# Patient Record
Sex: Female | Born: 1975 | Race: White | Hispanic: No | Smoking: Current every day smoker
Health system: Southern US, Community
[De-identification: ages and names within clinical notes are randomized; demographics above are authoritative.]

## PROBLEM LIST (undated history)

## (undated) DIAGNOSIS — K581 Irritable bowel syndrome with constipation: Secondary | ICD-10-CM

## (undated) DIAGNOSIS — I2699 Other pulmonary embolism without acute cor pulmonale: Secondary | ICD-10-CM

## (undated) DIAGNOSIS — N920 Excessive and frequent menstruation with regular cycle: Secondary | ICD-10-CM

## (undated) DIAGNOSIS — M545 Low back pain, unspecified: Secondary | ICD-10-CM

## (undated) DIAGNOSIS — G8929 Other chronic pain: Secondary | ICD-10-CM

## (undated) DIAGNOSIS — D242 Benign neoplasm of left breast: Secondary | ICD-10-CM

## (undated) DIAGNOSIS — N943 Premenstrual tension syndrome: Secondary | ICD-10-CM

## (undated) HISTORY — DX: Benign neoplasm of left breast: D24.2

## (undated) HISTORY — DX: Premenstrual tension syndrome: N94.3

## (undated) HISTORY — DX: Excessive and frequent menstruation with regular cycle: N92.0

## (undated) HISTORY — DX: Irritable bowel syndrome with constipation: K58.1

## (undated) HISTORY — DX: Other pulmonary embolism without acute cor pulmonale: I26.99

## (undated) HISTORY — PX: APPENDECTOMY: SHX54

---

## 2001-05-18 ENCOUNTER — Other Ambulatory Visit: Admission: RE | Admit: 2001-05-18 | Discharge: 2001-05-18 | Payer: Self-pay | Admitting: *Deleted

## 2003-06-07 ENCOUNTER — Other Ambulatory Visit: Admission: RE | Admit: 2003-06-07 | Discharge: 2003-06-07 | Payer: Self-pay | Admitting: Obstetrics and Gynecology

## 2004-03-12 ENCOUNTER — Ambulatory Visit (HOSPITAL_COMMUNITY): Admission: RE | Admit: 2004-03-12 | Discharge: 2004-03-12 | Payer: Self-pay | Admitting: Obstetrics and Gynecology

## 2004-05-10 ENCOUNTER — Ambulatory Visit (HOSPITAL_COMMUNITY): Admission: RE | Admit: 2004-05-10 | Discharge: 2004-05-10 | Payer: Self-pay | Admitting: Obstetrics and Gynecology

## 2004-08-21 ENCOUNTER — Other Ambulatory Visit: Admission: RE | Admit: 2004-08-21 | Discharge: 2004-08-21 | Payer: Self-pay | Admitting: Obstetrics and Gynecology

## 2007-09-18 ENCOUNTER — Ambulatory Visit (HOSPITAL_COMMUNITY): Admission: RE | Admit: 2007-09-18 | Discharge: 2007-09-18 | Payer: Self-pay | Admitting: Obstetrics & Gynecology

## 2008-02-05 ENCOUNTER — Ambulatory Visit (HOSPITAL_COMMUNITY): Admission: RE | Admit: 2008-02-05 | Discharge: 2008-02-05 | Payer: Self-pay | Admitting: Obstetrics & Gynecology

## 2008-02-11 ENCOUNTER — Ambulatory Visit (HOSPITAL_COMMUNITY): Admission: RE | Admit: 2008-02-11 | Discharge: 2008-02-11 | Payer: Self-pay | Admitting: Family Medicine

## 2008-02-16 ENCOUNTER — Observation Stay (HOSPITAL_COMMUNITY): Admission: EM | Admit: 2008-02-16 | Discharge: 2008-02-17 | Payer: Self-pay | Admitting: Emergency Medicine

## 2008-02-17 ENCOUNTER — Encounter: Payer: Self-pay | Admitting: Obstetrics and Gynecology

## 2008-03-11 ENCOUNTER — Ambulatory Visit: Payer: Self-pay | Admitting: Internal Medicine

## 2008-03-28 ENCOUNTER — Ambulatory Visit: Payer: Self-pay | Admitting: Internal Medicine

## 2008-03-28 ENCOUNTER — Ambulatory Visit (HOSPITAL_COMMUNITY): Admission: RE | Admit: 2008-03-28 | Discharge: 2008-03-28 | Payer: Self-pay | Admitting: Internal Medicine

## 2008-03-28 ENCOUNTER — Encounter: Payer: Self-pay | Admitting: Internal Medicine

## 2008-04-11 ENCOUNTER — Ambulatory Visit: Payer: Self-pay | Admitting: Gastroenterology

## 2008-04-14 ENCOUNTER — Ambulatory Visit (HOSPITAL_COMMUNITY): Admission: RE | Admit: 2008-04-14 | Discharge: 2008-04-14 | Payer: Self-pay | Admitting: Internal Medicine

## 2008-04-29 ENCOUNTER — Encounter (INDEPENDENT_AMBULATORY_CARE_PROVIDER_SITE_OTHER): Payer: Self-pay | Admitting: General Surgery

## 2008-04-29 ENCOUNTER — Ambulatory Visit (HOSPITAL_COMMUNITY): Admission: RE | Admit: 2008-04-29 | Discharge: 2008-04-29 | Payer: Self-pay | Admitting: General Surgery

## 2008-09-19 ENCOUNTER — Other Ambulatory Visit: Admission: RE | Admit: 2008-09-19 | Discharge: 2008-09-19 | Payer: Self-pay | Admitting: Obstetrics and Gynecology

## 2008-11-19 ENCOUNTER — Emergency Department (HOSPITAL_COMMUNITY): Admission: EM | Admit: 2008-11-19 | Discharge: 2008-11-19 | Payer: Self-pay | Admitting: Emergency Medicine

## 2009-02-17 IMAGING — US US PELVIS COMPLETE MODIFY
1 series · 14 of 25 positions shown · non-contrast
Comparison: none

HISTORY: Right lower quadrant pain, history of ovarian cyst

[Series 1: us pelvis complete modify · 0.30mm/px · 14 of 69 slices shown]
[im 1/69]
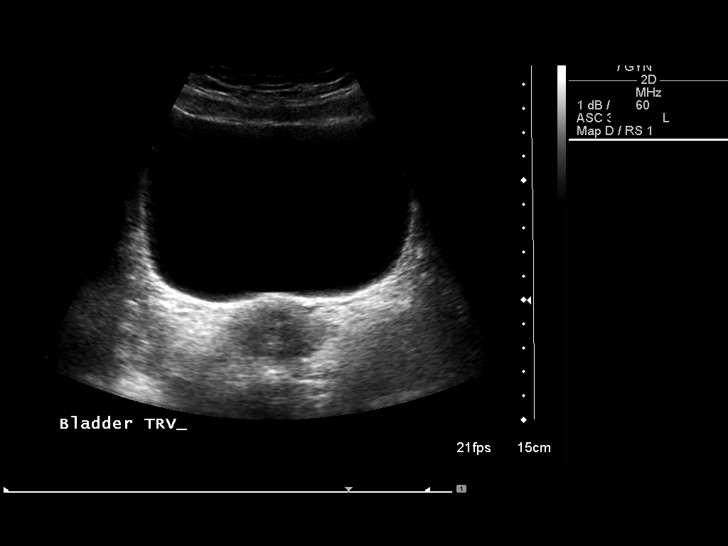
[im 6/69]
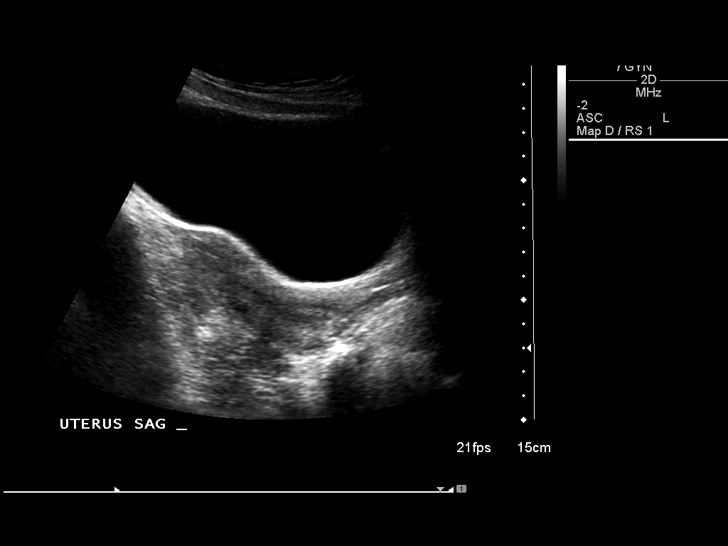
[im 12/69]
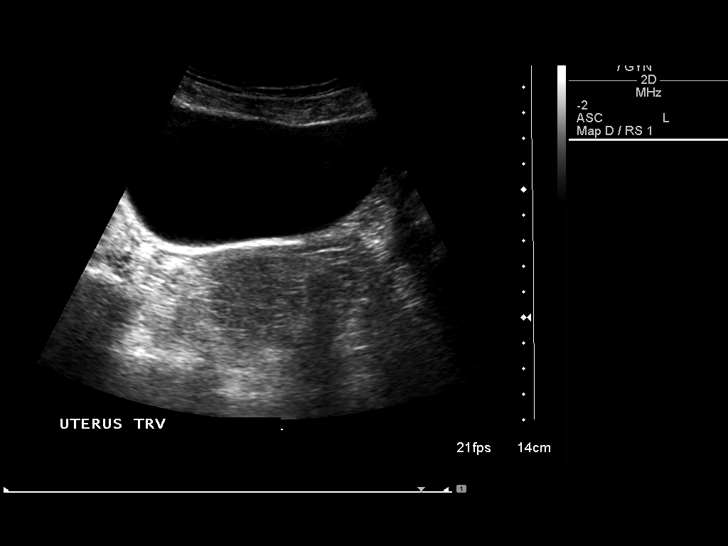
[im 18/69]
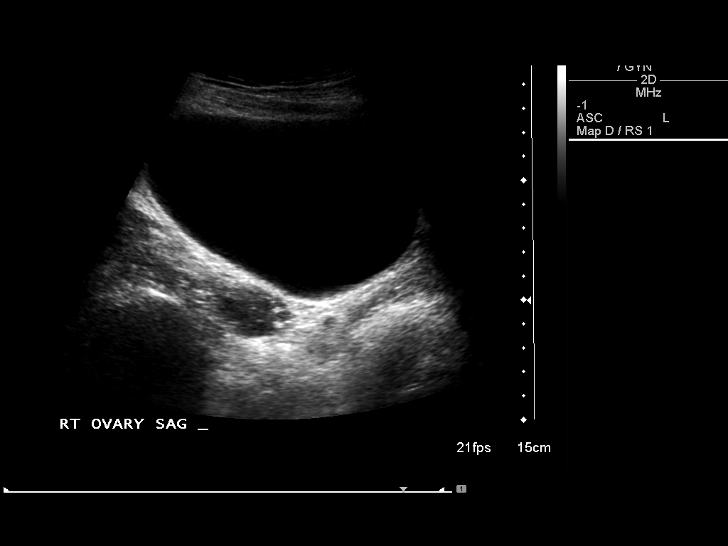
[im 23/69]
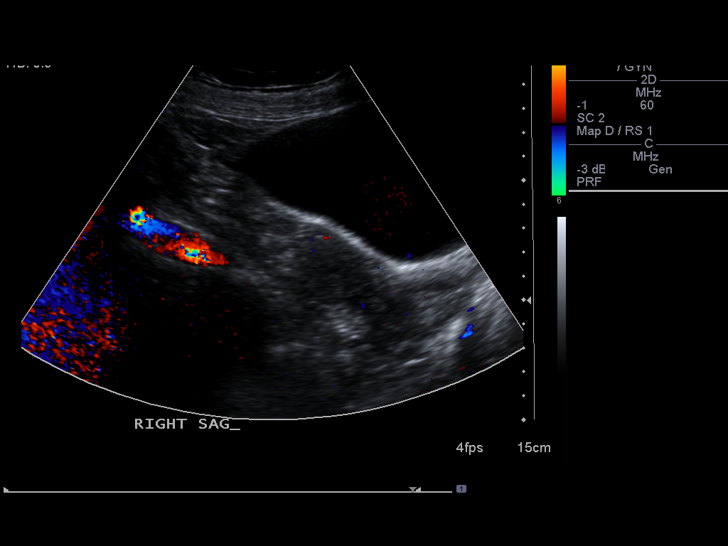
[im 26/69]
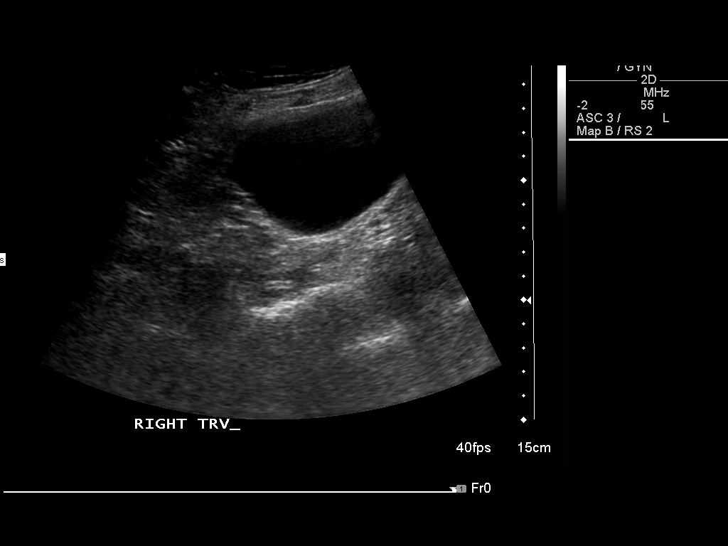
[im 32/69]
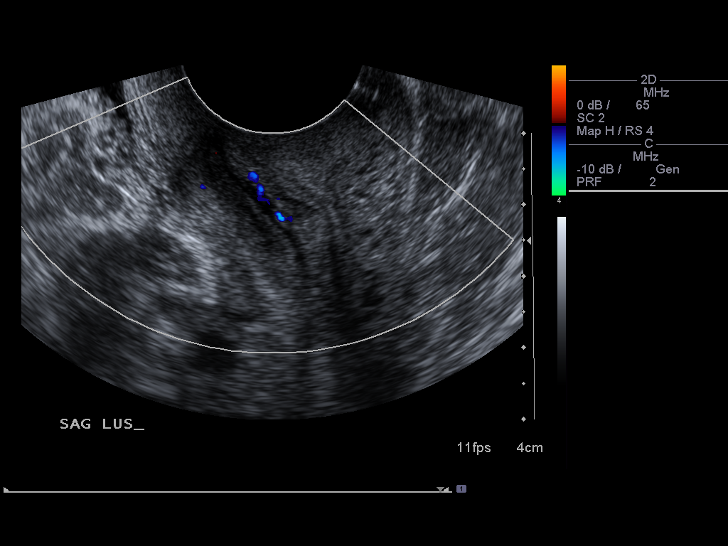
[im 37/69]
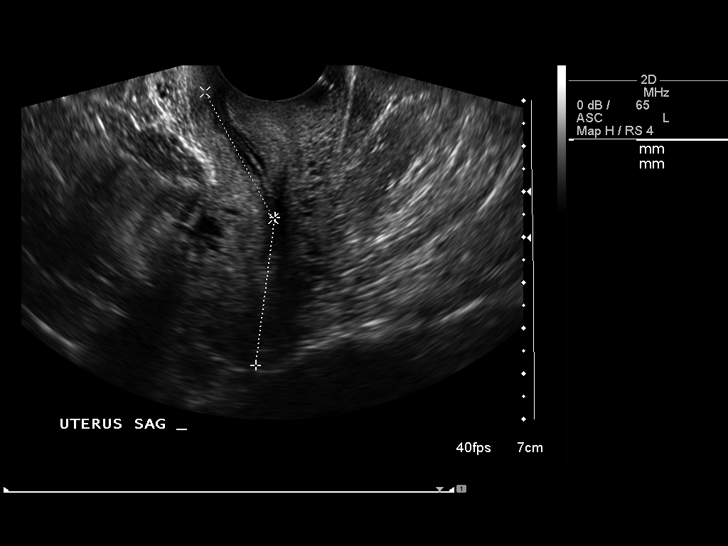
[im 43/69]
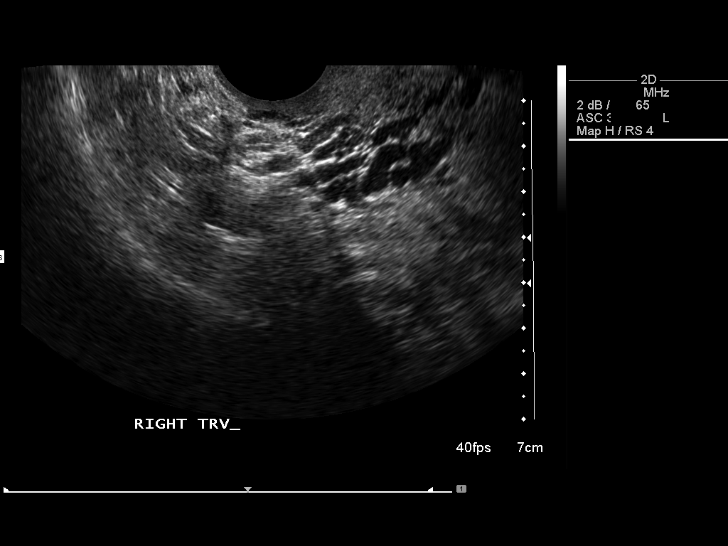
[im 46/69]
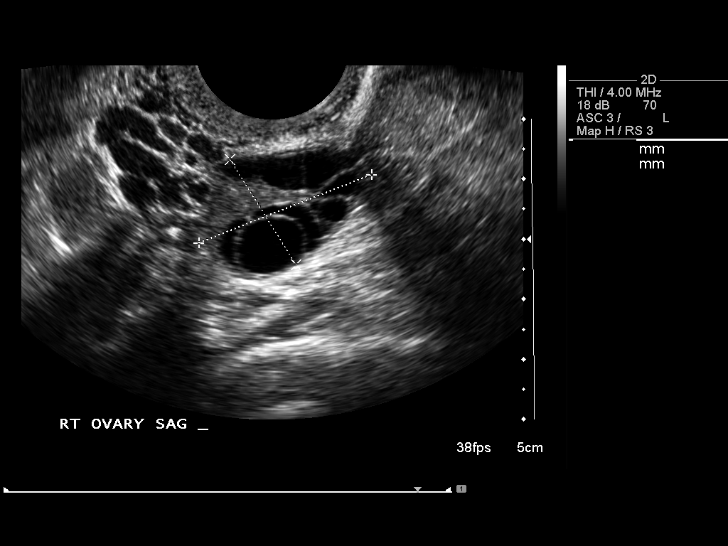
[im 52/69]
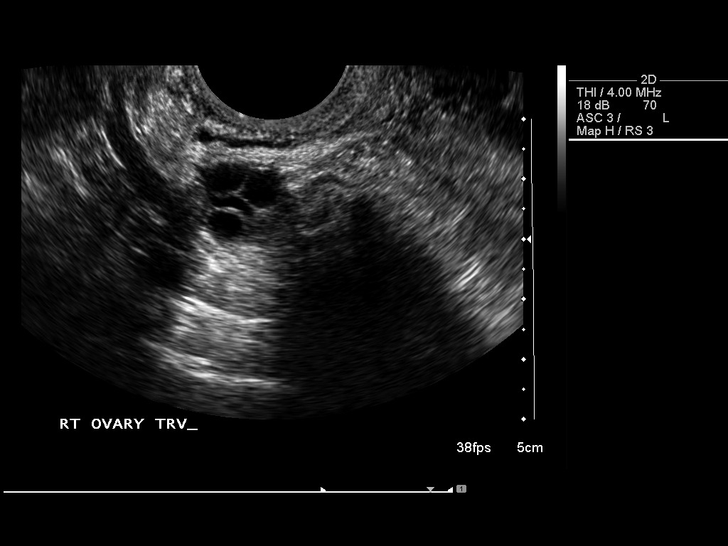
[im 57/69]
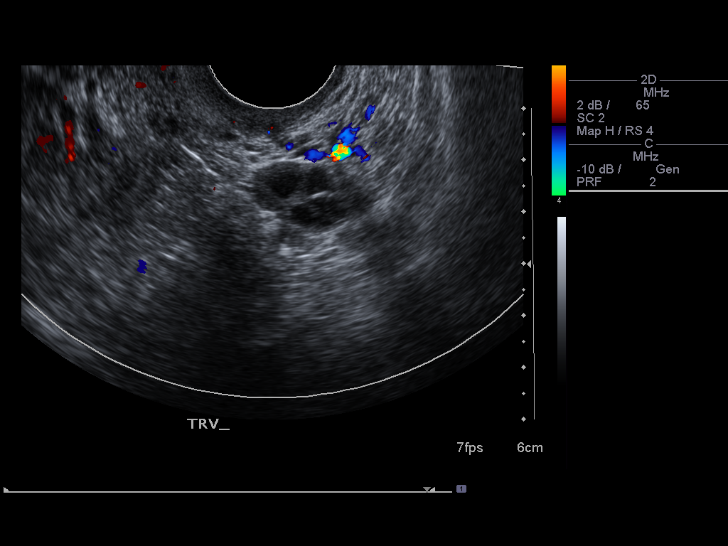
[im 63/69]
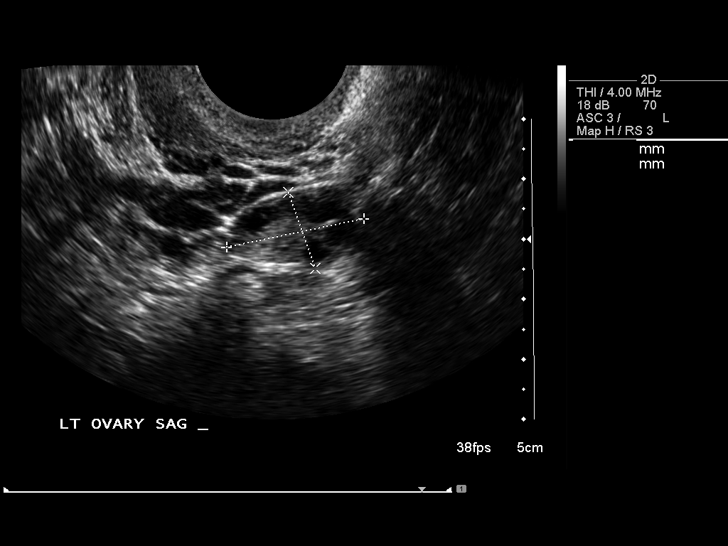
[im 69/69]
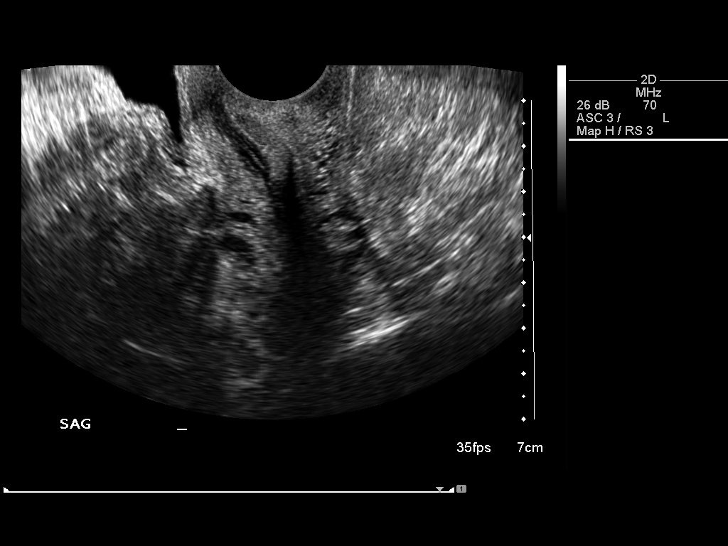

[14 of 25 positions shown; findings below may reference images not displayed]

ULTRASOUND PELVIS TRANSABDOMINAL COMPLETE MODIFIED:
ULTRASOUND PELVIS TRANSVAGINAL NON-OB:

Transabdominal and endovaginal sonography of pelvis performed.
Uterus, ovaries, adnexal regions, and pelvic cul-de-sac assessed.

Uterus anteverted, 7.5 cm length x 2.3 cm AP x 4.3 cm transverse.
Uterus poorly visualized on transvaginal imaging due to anteversion.
Endometrial stripe approximately 7 mm thick on transabdominal images, though
suboptimally visualized.
At the lower uterine segment, irregular hypoechoic focus is identified at
endometrial complex, which could represent polyp, blood, or mass.
No free pelvic fluid.
Left ovary normal size and morphology, 2.3 x 1.3 x 2.1 cm.
Right ovary normal size and morphology, tube 0.1 x 2.3 x 3.1 cm.
No adnexal masses.
IMPRESSION: Unremarkable appearance of ovaries.
Anteverted uterus, not well imaged, with questionable area of hypoechogenicity
at lower uterine segment which could represent blood, polyp, or tumor.
Consider MR followup to characterize or followup ultrasound in 2 to 3 months to
establish stability.

## 2009-08-27 ENCOUNTER — Inpatient Hospital Stay (HOSPITAL_COMMUNITY): Admission: AD | Admit: 2009-08-27 | Discharge: 2009-08-27 | Payer: Self-pay | Admitting: Obstetrics & Gynecology

## 2009-09-11 ENCOUNTER — Ambulatory Visit: Payer: Self-pay | Admitting: Advanced Practice Midwife

## 2009-09-11 ENCOUNTER — Inpatient Hospital Stay (HOSPITAL_COMMUNITY): Admission: AD | Admit: 2009-09-11 | Discharge: 2009-09-13 | Payer: Self-pay | Admitting: Obstetrics and Gynecology

## 2010-05-02 ENCOUNTER — Other Ambulatory Visit: Admission: RE | Admit: 2010-05-02 | Discharge: 2010-05-02 | Payer: Self-pay | Admitting: Obstetrics and Gynecology

## 2011-04-05 LAB — CBC
Hemoglobin: 12 g/dL (ref 12.0–15.0)
MCHC: 34 g/dL (ref 30.0–36.0)
RDW: 13 % (ref 11.5–15.5)

## 2011-04-05 LAB — RPR: RPR Ser Ql: NONREACTIVE

## 2011-04-06 LAB — URINALYSIS, ROUTINE W REFLEX MICROSCOPIC
Glucose, UA: NEGATIVE mg/dL
Ketones, ur: 15 mg/dL — AB
Protein, ur: NEGATIVE mg/dL

## 2011-05-14 NOTE — Consult Note (Signed)
NAMEADILEN, PAVELKO                ACCOUNT NO.:  [id number]   MEDICAL RECORD NO.:  [id number]          PATIENT TYPE:  AMB   LOCATION:  DAY                           FACILITY:  [facility]   PHYSICIAN:  R. Roetta Sessions, M.D. DATE OF BIRTH:  [birth year]   DATE OF CONSULTATION:  DATE OF DISCHARGE:                                 CONSULTATION   REFERRING PHYSICIAN:  Patrica Duel, M.D.   GYNECOLOGIST:  Tilda Burrow, M.D.   REASON FOR CONSULTATION:  Chronic abdominal pain.   HISTORY OF PRESENT ILLNESS:  [name] is a 35 year old female who has  had a 40-month history of constant right lower quadrant abdominal pain  which does seem to radiate across the pelvis to the left as well as into  the low back.  She has had an ultrasound which showed an endometrial  polyp followed by a hysteroscopy with polyp resection and a diagnostic  laparoscopy which was benign.  She has also had a CT scan of the abdomen  and pelvis with contrast, which was negative.  She tells me the pain  feels like a knot.  It is worse when she eats too much.  It generally  is worse later in a day.  The pain is a radiating-type pain.  It is 8/10  on pain scale at the worst.  She does give history of alternating  diarrhea where she can have three loose, urgent stools per day or  constipation where she can go 3-4 days without a bowel movement.  She  denies any rectal bleeding or melena.  She denies any mucus in her  stools.  She rarely has heartburn or indigestion.  She denies any  dysphagia or odynophagia.  Denies any anorexia.  Her weight has steadily  increased.  She has been taking Vicodin intermittently for pain, which  does seem to help, but notes not taking it too frequently as she has a 66-  year-old son.   PAST MEDICAL AND SURGICAL HISTORY:  1. Migraine headaches.  2. Endometrial polypectomy, hysteroscopy and negative diagnostic last      laparoscopy by Dr. Emelda Fear February 16, 2008.   CURRENT MEDICATIONS:  1.  Vicodin 5/500 mg p.r.n.  2. Low-Ogestrel 28 once daily.   ALLERGIES:  No known drug allergies.   FAMILY HISTORY:  There is no known family history of colorectal  carcinoma or liver or chronic GI problems.  Mother, age 48, has history  of breast cancer.  Father, age 22, is healthy.  Two healthy brothers.   SOCIAL HISTORY:  Ms. Moffa is married.  She has a 64-year-old healthy  son.  She is employed as a Therapist, sports.  She has a  10 pack-year history of tobacco use.  Denies any drug use.  Rarely  consumes alcohol.   REVIEW OF SYSTEMS:  See HPI.  GU:  She has noted some frequency but  recent UA was negative.  GYN:  See HPI.  Is on birth control pills.   PHYSICAL EXAM:  VITAL SIGNS:  Weight 211.5 pounds, height 70 inches,  temperature  98.5, blood 102/62, pulse 88.  GENERAL:  She is a well-developed, well-nourished, overweight has female  in no acute distress.  HEENT:  Sclerae are clear, nonicteric.  Conjunctivae are pink.  Pharynx  pink and moist without any lesions.  NECK:  Supple without mass or thyromegaly.  CHEST:  Heart regular rate and rhythm, regular normal S1 and S2, without  murmurs, thrills, rubs or gallops.  Lungs clear to auscultation  bilaterally.  ABDOMEN:  Positive bowel sounds x4.  No bruits auscultated.  Soft,  nontender, nondistended, without palpable mass or hepatosplenomegaly.  No rebound tenderness or guarding.  RECTAL:  No external lesions visualized.  Good sphincter tone.  No  internal masses palpated.  Small amount of light brown stool was  obtained from the vault, which is Hemoccult-positive.   Laboratory studies from February 16, 2008:  Hemoglobin 12.6. hematocrit  37, WBC 10.1, platelets 221.  Sodium 140, potassium 3.9, chloride 107,  CO2 24, glucose 88, BUN 3, creatinine 0.83 and calcium 9.3.  total  bilirubin 0.5, alkaline phosphatase 60, AST 20, ALT 13, total protein  6.5 and albumin 3.5.  Urine HCG was negative.  Urinalysis  negative.   IMPRESSION:  [name] is a 35 year old female with a 19-month history of  chronic abdominal pain that mostly originates in the right lower  quadrant and radiates across her lower pelvis as well as to her low  back.  She does note an alternating diarrhea and constipation pattern,  which is consistent with irritable bowel syndrome.  She recently had a  diagnostic laparoscopy by Dr. Emelda Fear, which was benign.  She is found  to be Hemoccult-positive on exam and recent CT scan of the abdomen and  pelvis was reassuring as well.  I do feel that the Hemoccult-positive  stool along with her chronic abdominal pain and change in bowel habits  warrants further evaluation to rule out inflammatory bowel disease;  however we may be dealing with irritable bowel syndrome and benign  anorectal source of Hemoccult-positive stool.   PLAN:  1. Begin Benefiber or Fiber Choice daily.  2. Levsin 0.125 mg a.c. and bedtime p.r.n. diarrhea, #60 with one      refill as I warned of constipation.  3. Colonoscopy with Dr. Jena Gauss in the near future.  I discussed the      procedure including the risks and benefits, including but not      limited to bleeding, infection, perforation and drug reaction.  She      agrees and a signed consent will be obtained.     Thank you, Dr. Nobie Putnam, of allowing [place] to participate in the care of  Ms. Stumph.      Lorenza Burton, N.P.      Jonathon Bellows, M.D.  Electronically Signed    KJ/MEDQ  D:  03/11/2008  T:  03/11/2008  Job:  [id number]   cc:   Patrica Duel, M.D.  Fax: [phone]   Tilda Burrow, M.D.  Fax: [phone]

## 2011-05-14 NOTE — Op Note (Signed)
[name], [name]                ACCOUNT NO.:  [id number]   MEDICAL RECORD NO.:  [id number]          PATIENT TYPE:  AMB   LOCATION:  DAY                           FACILITY:  [facility]   PHYSICIAN:  R. Roetta Sessions, M.D. DATE OF BIRTH:  [birth year]   DATE OF PROCEDURE:  03/01/2008  DATE OF DISCHARGE:                               OPERATIVE REPORT   INDICATIONS FOR PROCEDURE:  A 31-year lady with a 33-month history of  right lower quadrant abdominal pain that radiates across her lower  pelvis and into her back.  She has significant swings in bowel function  from constipation and diarrhea.  She was found to be occult-blood  positive on digital rectal exam in our office.  An ileocolonoscopy will  now be done to further evaluate her symptoms.  Of note, her appendix  remains in situ.  The risks, benefits, alternatives, and limitations  have been reviewed and her questions answered.  She is agreeable.  Please see the documentation in medical record.   PROCEDURE NOTE:  O2 saturation, blood pressure, pulse, and respirations  were monitored the entire procedure.   ANESTHESIA:  Conscious sedation with Versed 5 mg IV and Demerol 100 mg  IV in divided doses.  Instrument Pentax video chip system.   PROCEDURE FINDINGS:  Digital rectal exam revealed no abnormalities.  Prep was good.  Colonic mucosa surveyed from the rectosigmoid junction  through the left transverse right colon to the appendiceal orifice,  ileocecal valve and cecum.  These structures were well seen and  photographed for the record.  The terminal ileum was intubated a good 15  cm from the level of the scope and slowly cautiously withdrawn.  All  previous mentioned mucosal surfaces were again seen.  The colonic mucosa  appeared entirely normal except for a 3-mm, flat, adenomatous-appearing  polyp in the mid descending colon which was cold biopsied/removed and  the terminal ileal mucosa appeared normal.  The scope was pulled down  the  rectum where on further examination of the rectal mucosa, including  retroflex view of the anal verge and retroflex view of anal canal and  rectum, demonstrated only minimal anal canal hemorrhoids.  The patient  tolerated the procedure well and was reactive and was reactive.   DIAGNOSTIC IMPRESSION:  1. Minimal anal canal hemorrhoids, otherwise negative rectum.  2. Diminutive mid descending colon polyp status post cold biopsy      removal.  The colonic os terminal ileum mucosa appeared normal.  3. No signs of inflammatory bowel disease.  Either EC or Crohn disease      based on today's study.   The patient has a significant wide swings bowel function most consistent  with irritable bowel syndrome.  However, right lower quadrant abdominal  pain.  This is a little atypical and we need to consider the entity of  chronic appendicitis.   RECOMMENDATIONS:  1. Follow up on path.  2. Continue Benefiber twice a day.  3. Continue Levsin 0.125 mg morning and at bedtime, p.r.n. diarrhea.  4. Further recommendations to follow in the very near future.  Jonathon Bellows, M.D.  Electronically Signed     RMR/MEDQ  D:  03/28/2008  T:  03/28/2008  Job:  [id number]   cc:   Patrica Duel, M.D.  Fax: [phone]   Tilda Burrow, M.D.  Fax: [phone]

## 2011-05-14 NOTE — Op Note (Signed)
NAMETURNER, KUNZMAN NO.:  [id number]   MEDICAL RECORD NO.:  [id number]          PATIENT TYPE:  OBV   LOCATION:  A306                          FACILITY:  [facility]   PHYSICIAN:  Tilda Burrow, M.D. DATE OF BIRTH:  1976-03-06   DATE OF PROCEDURE:  DATE OF DISCHARGE:  02/17/2008                               OPERATIVE REPORT   PREOPERATIVE DIAGNOSES:  Endometrial polyp, pelvic pain secondary to  endometrial polyp.   POSTOPERATIVE DIAGNOSES:  Endometrial polyp, pelvic pain secondary to  endometrial polyp.   PROCEDURES:  Hysteroscopy, excision of endometrial polyp, diagnostic  laparoscopy.   SURGEON:  Ferguson.   ASSISTANT:  Blackwell, CST.   ANESTHESIA:  General.   COMPLICATIONS:  None.   FINDINGS:  Small lower uterine segment endometrial polyp. Photo  documented and excised. Soft, thin endometrial cavity tissue. Normal  tubal ostia bilaterally.  Normal appearing ovaries. Normal appearing  tubes. No evidence of endometriosis on laparoscopy.   DETAILS OF PROCEDURE:  The patient was taken to the operating room,  prepped and draped for vaginal procedure. The cervix was grasped with a  single tooth tenaculum, sounded in the anteflexed position to 8 cm.  Dilated to 25 [place] allowing introduction of the 30 degree rigid  hysteroscope. Photo documentation in photo #1 shows the uterine fundus  with contact marks from the uterine sounds. There was no evidence of  uterine perforation.  Tubal ostia were visible. The endometrial polyp  was visible. Documented photos #2, 3 and 4. The polyp was then excised  at its base without difficulty. The specimen was extracted and was thin  and fragile enough to be suctioned up by the suction device.  There was  no suspicion of malignant characteristics. Due to the relatively small  size of the endometrial polyp, it was felt that laparoscopy would  confirm that no tubal pathology existed as had been suggested previously  by  ultrasound. Laparoscopic procedure was then performed.   Laparoscopic procedure consisted of reprepping and draping with a Foley  catheter in placed and Hulka tenaculum attached to the cervix for  uterine manipulation.  The infraumbilical vertical 1 cm skin incision  was made. A Veress needle introduced being careful to orient towards the  pelvis with three liters of CO2 introduced under 8 mmHg pressure.  Laparoscopic 11 mm trocar was introduced without difficulty. The pelvis  visualized and suprapubic trocar placed under direct visualization  without difficulty. Photos were taken of the uterine fundus.  There was  evidence of some intraperitoneal extravasation of hysteroscopy fluid  through the patent tubes. This was clear and sterile. The patient had  received antibiotics prophylaxis at the start of the case.  The pelvis  was inspected and the tubes and ovaries bilaterally documented and was  grossly normal.  Photo #7 shows cul-de-sac between the uterosacral  ligaments with absolutely no evidence of endometriosis. The patient's  right tube and ovary documented well and photo #8 and photo #9 shows a  normal appendix.  The patient went to the recovery room in excellent  condition after deflation of the abdomen,  closure of the fascia at the  umbilicus and closure of the skin subcuticularly at both sites.  Steri-  Strips were placed and the patient went to the recovery room in stable  condition.  Sponge and needle counts correct.      Tilda Burrow, M.D.  Electronically Signed     JVF/MEDQ  D:  02/17/2008  T:  02/18/2008  Job:  [id number]   cc:   [facility]

## 2011-05-14 NOTE — H&P (Signed)
[name], [name] NO.:  [id number]   MEDICAL RECORD NO.:  [id number]          PATIENT TYPE:  AMB   LOCATION:  DAY                           FACILITY:  [facility]   PHYSICIAN:  [name], M.D.  DATE OF BIRTH:  May 05, 1976   DATE OF ADMISSION:  04/29/2008  DATE OF DISCHARGE:  LH                              HISTORY & PHYSICAL   CHIEF COMPLAINT:  Chronic right lower quadrant abdominal pain.   HISTORY OF PRESENT ILLNESS:  The patient is a 35 year old white female  who is referred for evaluation and treatment of chronic right lower  quadrant abdominal pain.  It has been present for many months and seems  to be getting worse.  Workup including colonoscopy and diagnostic  laparoscopy had not been helpful.  It is felt that she may have  irritable bowel syndrome, but the right lower quadrant abdominal pain is  atypical.  It is not associated with movement.  No hernias have been  noted.   PAST MEDICAL HISTORY:  As noted above.   PAST SURGICAL HISTORY:  Laparoscopy with polyp removal in February of  2009.   CURRENT MEDICATIONS:  Birth control pills, Vicodin, and hyoscyamine.   ALLERGIES:  No known drug allergies.   REVIEW OF SYSTEMS:  The patient smokes half a pack of cigarettes a day.  She denies any significant alcohol use.   PHYSICAL EXAMINATION:  The patient is a well-developed, well-nourished  white female in no acute distress.  LUNGS:  Clear to auscultation with equal breath sounds bilaterally.  HEART:  Reveals regular rate and rhythm without S3, S4, or murmurs.  ABDOMEN:  Soft and distended.  She has occasional tenderness to  palpation at McBurney's point.  No hepatosplenomegaly, masses, or  hernias identified.   IMPRESSION:  Chronic right lower quadrant abdominal pain, (?) chronic  appendicitis.   PLAN:  The patient is scheduled for laparoscopic appendectomy on Apr 29, 2008.  The risks and benefits of the procedure including bleeding,  infection, and  the possibility of recurrence of the pain were fully  explained to the patient, who gave informed consent.      [name], M.D.  Electronically Signed     MAJ/MEDQ  D:  04/26/2008  T:  04/27/2008  Job:  [id number]   cc:   R. Roetta Sessions, M.D.  [address]  [place]  [place] [address]   Tilda Burrow, M.D.  Fax: [phone]   [facility]  Fax: [phone]

## 2011-05-14 NOTE — Op Note (Signed)
NAMESOJOURNER, BEHRINGER NO.:  [id number]   MEDICAL RECORD NO.:  [id number]          PATIENT TYPE:  AMB   LOCATION:  DAY                           FACILITY:  [facility]   PHYSICIAN:  Dalia Heading, M.D.  DATE OF BIRTH:  03-22-1976   DATE OF PROCEDURE:  04/29/2008  DATE OF DISCHARGE:                               OPERATIVE REPORT   PREOPERATIVE DIAGNOSES:  Chronic right lower quadrant abdominal pain,  chronic appendicitis.   POSTOPERATIVE DIAGNOSES:  Chronic right lower quadrant abdominal pain,  chronic appendicitis.   PROCEDURE:  Laparoscopic appendectomy.   SURGEON:  Dalia Heading, MD   ANESTHESIA:  General endotracheal.   INDICATIONS:  The patient is a 35 year old white female who was referred  for evaluation and treatment of chronic right lower quadrant abdominal  pain.  It has been present for many months and seems to be getting  worse.  An extensive workup preoperatively including colonoscopy and  diagnostic laparoscopy have not been helpful.  She does suffer from  irritable bowel syndrome, but her right lower quadrant abdominal pain is  atypical.  The patient now comes to the operating room for laparoscopic  appendectomy.  The risks and benefits of the procedure including  bleeding, infection, and the possibility of recurrence of the pain were  fully explained to the patient, gave informed consent.   PROCEDURE NOTE:  The patient was placed in the supine position.  After  induction of general endotracheal anesthesia, the abdomen was prepped  and draped using the usual sterile technique with Betadine.  Surgical  site confirmation was performed.   A supraumbilical incision was made down to the fascia.  A Veress needle  was introduced into the abdominal cavity, and confirmation of placement  was done using the saline drop test.  The abdomen was then insufflated  to 16 mmHg pressure.  An 11-mm trocar was introduced into the abdominal  cavity under direct  visualization without difficulty.  The patient was  placed in deeper Trendelenburg position.  An additional 12-mm trocar was  placed in the suprapubic region and a 5-mm trocar was placed in the left  lower quadrant region.  The appendix was visualized and noted to be  somewhat atrophic, but not inflamed.  The terminal ileum was inspected,  and there was no evidence of Crohn's disease or Meckel's diverticulum.  The liver and gallbladder were inspected and noted to be within normal  limits.  The mesoappendix was divided using the harmonic scalpel.  Vascular Endo-GIA was placed across the base of the appendix and fired.  The appendix was removed using an EndoCatch bag.  The staple line was  inspected and noted to be within normal limits.  All fluid and air were  then evacuated from the abdominal cavity prior to removal of the  trocars.   All wounds were irrigated with normal saline.  All wounds were injected  with 0.5% Sensorcaine.  The supraumbilical fascia as well as suprapubic  fascia were reapproximated using 0-Vicryl interrupted sutures.  All skin  incisions were closed using staples.  Betadine ointment and  dry sterile  dressings were applied.   All tape and needle counts were correct at the end of the procedure.  The patient was extubated in the operating room and went back to  recovery room awake and in stable condition.   COMPLICATIONS:  None.   SPECIMEN:  Appendix.   BLOOD LOSS:  Minimal.      Dalia Heading, M.D.  Electronically Signed     MAJ/MEDQ  D:  04/29/2008  T:  04/30/2008  Job:  [id number]   cc:   R. Roetta Sessions, M.D.  [address]  [place]  [place] [address]   Tilda Burrow, M.D.  Fax: [phone]

## 2011-05-14 NOTE — Assessment & Plan Note (Signed)
NAMEKATRINNA, [name]                 CHART#:  [id number]   DATE:  04/11/2008                       DOB:  03-03-1976   CHIEF COMPLAINT:  Follow up of abdominal pain.   SUBJECTIVE:  [name] is a 35 year old female who presents today for  followup.  She recently underwent a colonoscopy by Dr. Jena Gauss on March 28, 2008.  This was done for a 19-month history of right lower quadrant  abdominal pain which radiates across her lower pelvis and into her back.  She also had swings in bowel function from constipation to diarrhea and  was found to be occult blood positive on the digital rectal exam in the  office.  She had minimum anal canal hemorrhoids.  She had diminutive mid-  descending colon polyp which was hyperplastic.  Terminal ileum appeared  normal for 15 cm.  It was felt that her wide swing in bowel function  most likely was due to IBS.  There was question of chronic appendicitis  as the cause of her right lower quadrant abdominal pain.  Notably, the  patient had undergone hysteroscopy and diagnostic laparoscopy with Dr.  Christin Bach on February 17, 2008, for abdominal pain.  She was found  to have endometrial polyp, and it was felt that her pelvic pain was  related to this.  In that op note, it says the appendix appeared normal.   She states she continues to have constant lower abdominal pain on the  right side.  At times, the pain is worse than at other times.  It seems  to be worse after meals.  She describes it as a cramp and like somebody  kicks her in the stomach.  She notes it even more if she strains to have  a bowel movement.  She notes if she eats spicy or heavy foods, she will  have fecal urgency.  The Levsin seems to help her have more regular  bowel movements but really does not help the pain and causes her to be  fatigued.  She has lost 6 pounds with all of this because she is afraid  to eat.   CURRENT MEDICATIONS:  See update list.   ALLERGIES:  No known drug  allergies.   PHYSICAL EXAMINATION:  VITAL SIGNS:  Weight 205 pounds, down 6 pounds,  temp 99.1, blood pressure 110/82, pulse 88.  GENERAL:  Pleasant, well-nourished, well-developed Caucasian female in  no acute distress.  SKIN:  Warm and dry, no jaundice.  HEENT:  Sclerae are nonicteric.  Oropharyngeal mucosa moist and pink.  No lesions, erythema or exudate.  NECK:  No lymphadenopathy.  ABDOMEN:  Positive bowel sounds.  Abdomen is soft, nondistended.  She  has mild right lower quadrant tenderness to deep palpation, no rebound,  no guarding, no abdominal bruits or hernias, no organomegaly or masses.  LOWER EXTREMITIES:  No edema.   IMPRESSION:  [name] is a 35 year old lady with 66-month history of  chronic right lower quadrant abdominal pain and alteration of her bowel  movements with swings between constipation and diarrhea.  She did not  perceive any improvement of her pain after her surgery with excision of  endometrial polyp.  Her appendix appeared normal per Dr. Rayna Sexton  operative note.  Her CT that she had back on February 11, 2008, was done  without contrast and was unremarkable.  Her pain is very focal on  examination.  Dr. Jena Gauss has questioned chronic appendicitis which is not  out of the realm of possibilities.  She has not received any improvement  in the pain with antispasmodic therapy.   PLAN:  Will discuss further with Dr. Jena Gauss.  I am not sure if it would  benefit for her to have a contrast CT of the abdomen and pelvis as the  next step, but we will discuss this with him tomorrow when he returns to  town.  She will continue FiberChoice increased to 2 daily.  She will  continue Levsin as needed for pain.       Tana Coast, P.A.  Electronically Signed     Kassie Mends, M.D.  Electronically Signed    LL/MEDQ  D:  04/11/2008  T:  04/11/2008  Job:  [id number]   cc:   Patrica Duel, M.D.

## 2011-05-14 NOTE — H&P (Signed)
[name], [name] NO.:  [id number]   MEDICAL RECORD NO.:  [id number]          PATIENT TYPE:  OBV   LOCATION:  A306                          FACILITY:  [facility]   PHYSICIAN:  Tilda Burrow, M.D. DATE OF BIRTH:  Nov 29, 1976   DATE OF ADMISSION:  02/16/2008  DATE OF DISCHARGE:  LH                              HISTORY & PHYSICAL   ADMISSION DIAGNOSIS:  1. Lower uterine segment endometrial polyp.  2. Pelvic pain felt secondary to prolapsing endometrial polyp.   HISTORY OF PRESENT ILLNESS:  This 35 year old female on continuous oral  contraceptives reliably with negative urine HCG is admitted from the  emergency room on the evening of January 16, 2008 after presenting with  worsening pelvic discomfort which protrudes from the lower abdomen into  the back and felt to be associated with the uterus.  The contact on exam  of the uterus reproduces the discomfort.  The patient recently had an  ultrasound at [facility] that suggested a 14 mm lower uterine  segment endometrial polyp. It  additionally suggested short segment of a  right hydrosalpinx.  Repeat ultrasound in our office could not identify  the hydrosalpinx when seen there on February 08, 2008.  Ultrasound  quality did not identify the lower uterine segment polyp but it is felt  likely that what is going on here is that she has a lower uterine  segment polyp which is intermittently occluding the lower uterine  segment and being constricted by the lower uterine segment resulting in  pain.  This may be the explanation of any fluid collection in the tube  as well.  The patient has been admitted overnight for pain management  and plans today are for to proceed with hysteroscopy D&C with removal of  the endometrial polyp.  The patient understands that the procedure is a  diagnostic procedure. Additionally while the patient has agreed to a  plan that includes consideration of laparoscopy if the hysteroscopy  findings are completely negative and there is no evidence of an  endometrial polyp.   PAST MEDICAL HISTORY:  Notable for cervical polyp in 2004 and 2005. She  had an ovarian cyst prior to beginning birth control pills.   FAMILY HISTORY:  Positive for breast cancer in the 82s in her mother and  her grandmother as well as in an aunt. There has been no testing of the  BRCA 1 status.  She has diabetes and hypertension.  Pap smears have all  been normal.   HABITS:  Cigarettes less than one pack per day.  Alcohol rare social. No  recreational drugs, college educated.   MEDICATIONS:  Oral contraceptives on a continuous basis only, Lo/Ovral.  She is a gravida 1, para 1.   SURGERIES:  None.   ALLERGIES:  None.   PHYSICAL EXAM:  Height 5 feet 7, weight 209, blood pressure 120/70.  Pupils equal, round, and reactive.  Extraocular movements intact.  NECK:  Supple.  CHEST:  Clear to auscultation.  ABDOMEN:  Nontender.  Bowel sounds present.  EXTERNAL GENITALIA:  Normal, nonpurulent. Cervix visibly  normal when  visualized through the speculum. The cervix is multiparous. Ultrasound  has been reviewed and shows a thickened tissue sample in the lower  uterine segment consistent with an endometrial polyp or possibly an  endocervical polyp that has protruded cephalad. Adnexa were negative on  the ultrasound other than the question of a small fluid-filled area that  may represent retrograde fluid collection in the right tube.   PLAN:  Hysteroscopy, removal of polyp, consideration of laparoscopy if  hysteroscopic findings are completely negative.      Tilda Burrow, M.D.  Electronically Signed     JVF/MEDQ  D:  02/17/2008  T:  02/17/2008  Job:  [id number]

## 2011-09-03 ENCOUNTER — Other Ambulatory Visit: Payer: Self-pay | Admitting: Adult Health

## 2011-09-03 ENCOUNTER — Other Ambulatory Visit (HOSPITAL_COMMUNITY)
Admission: RE | Admit: 2011-09-03 | Discharge: 2011-09-03 | Disposition: A | Discharge status: Home / Self Care | Payer: 59 | Source: Ambulatory Visit | Attending: Obstetrics and Gynecology | Admitting: Obstetrics and Gynecology

## 2011-09-03 DIAGNOSIS — Z01419 Encounter for gynecological examination (general) (routine) without abnormal findings: Secondary | ICD-10-CM | POA: Insufficient documentation

## 2011-09-20 LAB — CBC
Platelets: 221
WBC: 10.1

## 2011-09-20 LAB — URINALYSIS, ROUTINE W REFLEX MICROSCOPIC
Bilirubin Urine: NEGATIVE
Hgb urine dipstick: NEGATIVE
Specific Gravity, Urine: <1.005 — ABNORMAL LOW
Urobilinogen, UA: 0.2
pH: 6

## 2011-09-20 LAB — POCT I-STAT 4, (NA,K, GLUC, HGB,HCT)
HCT: 37
Hemoglobin: 12.6
Sodium: 140

## 2011-09-20 LAB — DIFFERENTIAL
Basophils Absolute: 0.1
Basophils Relative: 1
Eosinophils Relative: 2
Monocytes Absolute: 0.5
Monocytes Relative: 5

## 2011-09-20 LAB — URINE CULTURE

## 2011-09-20 LAB — COMPREHENSIVE METABOLIC PANEL
AST: 20
Albumin: 3.5
Alkaline Phosphatase: 60
Chloride: 107
GFR calc Af Amer: >60
Potassium: 3.1 — ABNORMAL LOW
Sodium: 140
Total Bilirubin: 0.5

## 2011-09-24 LAB — CBC
HCT: 35.4 — ABNORMAL LOW
MCV: 89.8
Platelets: 210
RBC: 3.95
WBC: 7.9

## 2011-09-24 LAB — BASIC METABOLIC PANEL
BUN: 6
Chloride: 105
GFR calc Af Amer: >60
GFR calc non Af Amer: >60
Potassium: 3.3 — ABNORMAL LOW
Sodium: 137

## 2011-10-01 LAB — WET PREP, GENITAL
Trich, Wet Prep: NONE SEEN
Yeast Wet Prep HPF POC: NONE SEEN

## 2011-10-01 LAB — CBC
HCT: 39
Hemoglobin: 13.3
MCHC: 34.1
MCV: 91.2
Platelets: 228
RBC: 4.28
RDW: 12.7
WBC: 6.8

## 2011-10-01 LAB — DIFFERENTIAL
Basophils Absolute: 0.1
Basophils Relative: 2 — ABNORMAL HIGH
Eosinophils Absolute: 0.2
Eosinophils Relative: 3
Lymphocytes Relative: 29
Lymphs Abs: 2
Monocytes Absolute: 0.5
Monocytes Relative: 8
Neutro Abs: 3.9
Neutrophils Relative %: 58

## 2011-10-01 LAB — GC/CHLAMYDIA PROBE AMP, GENITAL
Chlamydia, DNA Probe: NEGATIVE
GC Probe Amp, Genital: NEGATIVE

## 2011-10-01 LAB — RH IMMUNE GLOB WKUP(>/=20WKS)(NOT WOMEN'S HOSP): Antibody Screen: NEGATIVE

## 2011-10-01 LAB — BASIC METABOLIC PANEL
Chloride: 108
GFR calc non Af Amer: >60
Glucose, Bld: 97
Potassium: 4
Sodium: 140

## 2011-10-01 LAB — BASIC METABOLIC PANEL WITH GFR
BUN: 8
CO2: 28
Calcium: 9.8
Creatinine, Ser: 0.79
GFR calc Af Amer: >60

## 2011-10-01 LAB — HCG, QUANTITATIVE, PREGNANCY: hCG, Beta Chain, Quant, S: 125 — ABNORMAL HIGH

## 2012-06-11 ENCOUNTER — Other Ambulatory Visit: Payer: Self-pay

## 2012-09-08 ENCOUNTER — Other Ambulatory Visit: Payer: Self-pay | Admitting: Adult Health

## 2012-09-08 ENCOUNTER — Other Ambulatory Visit (HOSPITAL_COMMUNITY)
Admission: RE | Admit: 2012-09-08 | Discharge: 2012-09-08 | Disposition: A | Discharge status: Home / Self Care | Payer: 59 | Source: Ambulatory Visit | Attending: Obstetrics and Gynecology | Admitting: Obstetrics and Gynecology

## 2012-09-08 DIAGNOSIS — Z1151 Encounter for screening for human papillomavirus (HPV): Secondary | ICD-10-CM | POA: Insufficient documentation

## 2012-09-08 DIAGNOSIS — Z01419 Encounter for gynecological examination (general) (routine) without abnormal findings: Secondary | ICD-10-CM | POA: Insufficient documentation

## 2012-09-08 DIAGNOSIS — Z139 Encounter for screening, unspecified: Secondary | ICD-10-CM

## 2012-09-11 ENCOUNTER — Ambulatory Visit (HOSPITAL_COMMUNITY)
Admission: RE | Admit: 2012-09-11 | Discharge: 2012-09-11 | Disposition: A | Discharge status: Home / Self Care | Payer: 59 | Source: Ambulatory Visit | Attending: Adult Health | Admitting: Adult Health

## 2012-09-11 DIAGNOSIS — Z1231 Encounter for screening mammogram for malignant neoplasm of breast: Secondary | ICD-10-CM | POA: Insufficient documentation

## 2012-09-11 DIAGNOSIS — Z139 Encounter for screening, unspecified: Secondary | ICD-10-CM

## 2013-06-15 ENCOUNTER — Other Ambulatory Visit: Payer: Self-pay | Admitting: Adult Health

## 2013-06-22 ENCOUNTER — Other Ambulatory Visit (HOSPITAL_COMMUNITY): Payer: Self-pay | Admitting: Family Medicine

## 2013-06-22 ENCOUNTER — Ambulatory Visit (HOSPITAL_COMMUNITY)
Admission: RE | Admit: 2013-06-22 | Discharge: 2013-06-22 | Disposition: A | Discharge status: Home / Self Care | Payer: 59 | Source: Ambulatory Visit | Attending: Family Medicine | Admitting: Family Medicine

## 2013-06-22 DIAGNOSIS — M5137 Other intervertebral disc degeneration, lumbosacral region: Secondary | ICD-10-CM

## 2013-06-22 DIAGNOSIS — M545 Low back pain, unspecified: Secondary | ICD-10-CM | POA: Insufficient documentation

## 2013-06-25 ENCOUNTER — Other Ambulatory Visit (HOSPITAL_COMMUNITY): Payer: Self-pay | Admitting: Family Medicine

## 2013-06-25 DIAGNOSIS — M5137 Other intervertebral disc degeneration, lumbosacral region: Secondary | ICD-10-CM

## 2013-06-25 DIAGNOSIS — IMO0002 Reserved for concepts with insufficient information to code with codable children: Secondary | ICD-10-CM

## 2013-06-29 ENCOUNTER — Ambulatory Visit (HOSPITAL_COMMUNITY): Payer: 59

## 2013-06-29 ENCOUNTER — Ambulatory Visit (HOSPITAL_COMMUNITY)
Admission: RE | Admit: 2013-06-29 | Discharge: 2013-06-29 | Disposition: A | Discharge status: Home / Self Care | Payer: 59 | Source: Ambulatory Visit | Attending: Family Medicine | Admitting: Family Medicine

## 2013-06-29 DIAGNOSIS — M545 Low back pain, unspecified: Secondary | ICD-10-CM | POA: Insufficient documentation

## 2013-06-29 DIAGNOSIS — M5137 Other intervertebral disc degeneration, lumbosacral region: Secondary | ICD-10-CM

## 2013-06-29 DIAGNOSIS — IMO0002 Reserved for concepts with insufficient information to code with codable children: Secondary | ICD-10-CM

## 2013-06-29 DIAGNOSIS — M5126 Other intervertebral disc displacement, lumbar region: Secondary | ICD-10-CM | POA: Insufficient documentation

## 2013-07-12 ENCOUNTER — Emergency Department (HOSPITAL_COMMUNITY): Payer: 59

## 2013-07-12 ENCOUNTER — Emergency Department (HOSPITAL_COMMUNITY)
Admission: EM | Admit: 2013-07-12 | Discharge: 2013-07-13 | Disposition: A | Discharge status: Home / Self Care | Payer: 59 | Attending: Internal Medicine | Admitting: Internal Medicine

## 2013-07-12 ENCOUNTER — Encounter (HOSPITAL_COMMUNITY): Payer: Self-pay | Admitting: Emergency Medicine

## 2013-07-12 DIAGNOSIS — I2699 Other pulmonary embolism without acute cor pulmonale: Secondary | ICD-10-CM | POA: Insufficient documentation

## 2013-07-12 DIAGNOSIS — F17200 Nicotine dependence, unspecified, uncomplicated: Secondary | ICD-10-CM | POA: Insufficient documentation

## 2013-07-12 DIAGNOSIS — R0781 Pleurodynia: Secondary | ICD-10-CM

## 2013-07-12 DIAGNOSIS — M549 Dorsalgia, unspecified: Secondary | ICD-10-CM | POA: Diagnosis present

## 2013-07-12 DIAGNOSIS — G8929 Other chronic pain: Secondary | ICD-10-CM | POA: Insufficient documentation

## 2013-07-12 DIAGNOSIS — M545 Low back pain, unspecified: Secondary | ICD-10-CM | POA: Insufficient documentation

## 2013-07-12 DIAGNOSIS — Z720 Tobacco use: Secondary | ICD-10-CM | POA: Diagnosis present

## 2013-07-12 DIAGNOSIS — Z79899 Other long term (current) drug therapy: Secondary | ICD-10-CM | POA: Insufficient documentation

## 2013-07-12 HISTORY — DX: Low back pain: M54.5

## 2013-07-12 HISTORY — DX: Other chronic pain: G89.29

## 2013-07-12 HISTORY — DX: Low back pain, unspecified: M54.50

## 2013-07-12 MED ORDER — OXYCODONE-ACETAMINOPHEN 5-325 MG PO TABS
2.0000 | ORAL_TABLET | Freq: Once | ORAL | Status: AC
  Administered 2013-07-12: 2 via ORAL
  Filled 2013-07-12: qty 2

## 2013-07-12 NOTE — ED Notes (Signed)
PT. REPORTS CHRONIC LEFT BACK PAIN UNRELIEVED BY PRESCRIPTION HYDROCODONE , PT. STATED HISTORY OF " BULDGEING DISK  L4/L5" , DENIES URINARY SYMPTOMS/ NO FALL OR INJURY .AMBULATORY.

## 2013-07-12 NOTE — ED Provider Notes (Signed)
History    This chart was scribed for non-physician practitioner Renne Crigler, PA-C, working with Gilda Crease, by Donne Anon, ED Scribe. This patient was seen in room TR11C/TR11C and the patient's care was started at 2111.  CSN: [id number] Arrival date & time 07/12/13  2053  First MD Initiated Contact with Patient 07/12/13 2111     Chief Complaint  Patient presents with  . Back Pain    The history is provided by the patient. No language interpreter was used.   HPI Comments: [name] is a 37 y.o. female who has a history of chronic lower back pain -- presents to the Emergency Department complaining of gradual onset, gradually worsening, left back pain which worsened 3 hours PTA. She states she has a bulging disc as L4/L5. The pain is described as different from her chronic pain, and shoots up her back when she takes a deep breath. Her typical back pain shoots down her legs. She has tried hydrocodone and flexeril with little relief. These usually provide relief. She states she takes the pain medication only when she needs it, and not on a daily basis. She denies any recent trauma or injury. She denies incontinence, numbness or weakness in her extremities, SOB, fever or any other pain. She denies prior similar episodes. She denies recent travel or long car rides, or hx of DVT. She currently takes birth control pills. She currently smokes every day.   History reviewed. No pertinent past medical history. Past Surgical History  Procedure Laterality Date  . Appendectomy     No family history on file. History  Substance Use Topics  . Smoking status: Current Every Day Smoker  . Smokeless tobacco: Not on file  . Alcohol Use: Yes   OB History   Grav Para Term Preterm Abortions TAB SAB Ect Mult Living                 Review of Systems  Constitutional: Negative for fever.  HENT: Negative for sore throat and rhinorrhea.   Eyes: Negative for redness.  Respiratory:  Negative for cough and shortness of breath.   Cardiovascular: Negative for chest pain and leg swelling.  Gastrointestinal: Negative for nausea, vomiting, abdominal pain and diarrhea.  Genitourinary: Negative for dysuria.  Musculoskeletal: Positive for back pain. Negative for myalgias.  Skin: Negative for rash.  Neurological: Negative for weakness, numbness and headaches.    Allergies  Review of patient's allergies indicates no known allergies.  Home Medications   Current Outpatient Rx  Name  Route  Sig  Dispense  Refill  . cyclobenzaprine (FLEXERIL) 10 MG tablet   Oral   Take 10 mg by mouth 3 (three) times daily. Take for 10 days         . HYDROcodone-acetaminophen (VICODIN) 5-500 MG per tablet   Oral   Take 1 tablet by mouth every 6 (six) hours as needed for pain. For pain         . Levonorgestrel-Ethinyl Estradiol (SEASONIQUE) 0.15-0.03 &0.01 MG tablet   Oral   Take 1 tablet by mouth daily.          BP 130/63  Pulse 79  Temp(Src) 98.3 F (36.8 C) (Oral)  Resp 18  SpO2 97%  Physical Exam  Nursing note and vitals reviewed. Constitutional: She appears well-developed and well-nourished. No distress.  HENT:  Head: Normocephalic and atraumatic.  Eyes: Conjunctivae are normal. Right eye exhibits no discharge. Left eye exhibits no discharge.  Neck: Normal range  of motion. Neck supple. No tracheal deviation present.  Cardiovascular: Normal rate, regular rhythm, normal heart sounds and intact distal pulses.  Exam reveals no gallop and no friction rub.   No murmur heard. Pulmonary/Chest: Effort normal and breath sounds normal. No respiratory distress. She has no wheezes. She has no rales. She exhibits no tenderness.  Abdominal: Soft. There is no tenderness.  Musculoskeletal: Normal range of motion. She exhibits no edema and no tenderness.  Normal pulses. Tender to palpation along R thoracic area of back overlying posterior ribcage.   Neurological: She is alert.  Skin:  Skin is warm and dry.  Psychiatric: She has a normal mood and affect. Her behavior is normal.    ED Course  Procedures (including critical care time) DIAGNOSTIC STUDIES: Oxygen Saturation is 97% on RA, adequate by my interpretation.    COORDINATION OF CARE: 9:26 PM Discussed treatment plan which includes labs to screen for PE and pain medication with pt at bedside and pt agreed to plan.    Labs Reviewed  D-DIMER, QUANTITATIVE - Abnormal; Notable for the following:    D-Dimer, Quant 1.13 (*)    All other components within normal limits   No results found. 1. Back pain     Patient seen and examined. Work-up initiated. Medications ordered.   Vital signs reviewed and are as follows: Filed Vitals:   07/12/13 2102  BP: 130/63  Pulse: 79  Temp: 98.3 F (36.8 C)  Resp: 18   Given risk factors of +OCP, smoker, age > 35, new back pain with pleuritic component -- felt d-dimer indicated. This came back elevated at 1.13. Patient informed.  Patient feeling better after Percocet.  Patient currently pending CT scan of chest to rule out blood clot. Handoff to Dammen PA-C at shift change.   If negative, patient to be discharged home with pain medication and primary care physician followup.   MDM  Middle back pain, different than chronic back pain. Pending CT to rule out PE.  I personally performed the services described in this documentation, which was scribed in my presence. The recorded information has been reviewed and is accurate.    Renne Crigler, PA-C 07/13/13 0010

## 2013-07-13 ENCOUNTER — Encounter (HOSPITAL_COMMUNITY): Payer: Self-pay | Admitting: Radiology

## 2013-07-13 ENCOUNTER — Emergency Department (HOSPITAL_COMMUNITY): Payer: 59

## 2013-07-13 DIAGNOSIS — Z720 Tobacco use: Secondary | ICD-10-CM | POA: Diagnosis present

## 2013-07-13 DIAGNOSIS — I2699 Other pulmonary embolism without acute cor pulmonale: Secondary | ICD-10-CM

## 2013-07-13 DIAGNOSIS — M549 Dorsalgia, unspecified: Secondary | ICD-10-CM

## 2013-07-13 DIAGNOSIS — F17200 Nicotine dependence, unspecified, uncomplicated: Secondary | ICD-10-CM

## 2013-07-13 DIAGNOSIS — R0781 Pleurodynia: Secondary | ICD-10-CM | POA: Diagnosis present

## 2013-07-13 DIAGNOSIS — R071 Chest pain on breathing: Secondary | ICD-10-CM

## 2013-07-13 LAB — CBC
HCT: 35.7 % — ABNORMAL LOW (ref 36.0–46.0)
Hemoglobin: 12 g/dL (ref 12.0–15.0)
MCH: 30.7 pg (ref 26.0–34.0)
MCV: 91.3 fL (ref 78.0–100.0)
RBC: 3.91 MIL/uL (ref 3.87–5.11)

## 2013-07-13 LAB — POCT I-STAT, CHEM 8
BUN: 16 mg/dL (ref 6–23)
Creatinine, Ser: 0.9 mg/dL (ref 0.50–1.10)
Potassium: 3.8 mEq/L (ref 3.5–5.1)
Sodium: 141 mEq/L (ref 135–145)
TCO2: 25 mmol/L (ref 0–100)

## 2013-07-13 LAB — BASIC METABOLIC PANEL
CO2: 24 mEq/L (ref 19–32)
Chloride: 104 mEq/L (ref 96–112)
Creatinine, Ser: 0.75 mg/dL (ref 0.50–1.10)
Glucose, Bld: 79 mg/dL (ref 70–99)

## 2013-07-13 MED ORDER — RIVAROXABAN 15 MG PO TABS: 15.0000 mg | ORAL_TABLET | Freq: Two times a day (BID) | ORAL | Status: DC

## 2013-07-13 MED ORDER — OXYCODONE-ACETAMINOPHEN 5-325 MG PO TABS: 1.0000 | ORAL_TABLET | Freq: Four times a day (QID) | ORAL | Status: DC | PRN

## 2013-07-13 MED ORDER — RIVAROXABAN 20 MG PO TABS: 20.0000 mg | ORAL_TABLET | Freq: Every day | ORAL | Status: DC

## 2013-07-13 MED ORDER — ENOXAPARIN SODIUM 80 MG/0.8ML ~~LOC~~ SOLN
70.0000 mg | Freq: Two times a day (BID) | SUBCUTANEOUS | Status: DC
  Administered 2013-07-13: 70 mg via SUBCUTANEOUS
  Filled 2013-07-13: qty 0.8

## 2013-07-13 MED ORDER — IOHEXOL 350 MG/ML SOLN
100.0000 mL | Freq: Once | INTRAVENOUS | Status: AC | PRN
  Administered 2013-07-13: 100 mL via INTRAVENOUS

## 2013-07-13 NOTE — Progress Notes (Signed)
ANTICOAGULATION CONSULT NOTE - Initial Consult  Pharmacy Consult for : Lovenox Indication: pulmonary embolus  No Known Allergies  Patient Measurements: Weight: 160 lb (72.576 kg)  Vital Signs: Temp: 98.3 F (36.8 C) (07/14 2102) Temp src: Oral (07/14 2102) BP: 130/63 mmHg (07/14 2102) Pulse Rate: 79 (07/14 2102)  Labs: No results found for this basename: HGB, HCT, PLT, APTT, LABPROT, INR, HEPARINUNFRC, CREATININE, CKTOTAL, CKMB, TROPONINI,  in the last 72 hours  CrCl is unknown because there is no height on file for the current visit.   Medical History: History reviewed. No pertinent past medical history.  Medications:  Flexeril  Vicodin  Seasonique  Assessment: 37 yo female with PE for Lovenox  Goal of Therapy:  Full anticoagulation with Lovenox Monitor platelets by anticoagulation protocol: Yes   Plan:  Lovenox 70 mg SQ q12h  Eddie Candle 07/13/2013,1:23 AM

## 2013-07-13 NOTE — Consult Note (Signed)
Referring physician: Dr. Lavella Lemons  Patient's PCP: Colette Ribas, MD  Reason for consult: Pulmonary embolism  History of Present Illness: [name] is a 37 y.o. Caucasian female with history of chronic back pain which she had since February of 2014.  She reports that she has had disc bulge at L4/5 and has seen Dr. Jordan Likes, with neurosurgery.  Patient has been managed conservatively at home.  Yesterday afternoon she noted that she was having mid back pain and also pain that was worse with inspiration.  She presented to the emergency department for further evaluation, d-dimer was checked and was elevated.  CT of the chest with contrast was positive for bilateral small pulmonary emboli.  Hospitalist service was consulted for further care and management.  Patient reports that one week ago she drove with family to [facility] for 4 hours.  She does admit to smoking and uses birth control medications.  Denies any recent fever, chills, nausea, vomiting, chest pain, abdominal pain, diarrhea, headaches or vision changes.  Not complaining of shortness of breath, but does complain of pain with deep inhalation.  Review of Systems: All systems reviewed with the patient and positive as per history of present illness, otherwise all other systems are negative.  Past Medical History  Diagnosis Date  . Chronic low back pain    Past Surgical History  Procedure Laterality Date  . Appendectomy     Family History  Problem Relation Age of Onset  . Breast cancer Mother   . COPD Mother   . Heart Problems Father    History   Social History  . Marital Status: Married    Spouse Name: N/A    Number of Children: N/A  . Years of Education: N/A   Occupational History  . Not on file.   Social History Main Topics  . Smoking status: Current Every Day Smoker -- 1.00 packs/day  . Smokeless tobacco: Not on file  . Alcohol Use: Yes  . Drug Use: No  . Sexually Active: Not on file   Other Topics Concern  .  Not on file   Social History Narrative  . No narrative on file   Allergies: Review of patient's allergies indicates no known allergies.  Home Meds: Prior to Admission medications   Medication Sig Start Date End Date Taking? Authorizing Provider  cyclobenzaprine (FLEXERIL) 10 MG tablet Take 10 mg by mouth 3 (three) times daily. Take for 10 days   Yes Historical Provider, MD  HYDROcodone-acetaminophen (VICODIN) 5-500 MG per tablet Take 1 tablet by mouth every 6 (six) hours as needed for pain. For pain   Yes Historical Provider, MD  oxyCODONE-acetaminophen (PERCOCET/ROXICET) 5-325 MG per tablet Take 1-2 tablets by mouth every 6 (six) hours as needed for pain. 07/13/13   Angus Seller, PA-C  Rivaroxaban (XARELTO) 15 MG TABS tablet Take 1 tablet (15 mg total) by mouth 2 (two) times daily with a meal. For 21 day total, then 20 mg daily. 07/13/13   Cristal Ford, MD  Rivaroxaban (XARELTO) 20 MG TABS Take 1 tablet (20 mg total) by mouth daily with supper. Begin when 15 mg twice daily treatment (21 days total) is complete. 07/13/13   Cristal Ford, MD    Physical Exam: Blood pressure 114/63, pulse 74, temperature 98.3 F (36.8 C), temperature source Oral, resp. rate 14, weight 72.576 kg (160 lb), SpO2 99.00%. General: Awake, Oriented x3, No acute distress. HEENT: EOMI, Moist mucous membranes Neck: Supple CV: S1 and S2  Lungs: Clear to ascultation bilaterally Abdomen: Soft, Nontender, Nondistended, +bowel sounds. Ext: Good pulses. Trace edema. No clubbing or cyanosis noted. No obvious clots appreciated. Neuro: Cranial Nerves II-XII grossly intact. Has 5/5 motor strength in upper and lower extremities.  Lab results:  Recent Labs  07/13/13 0120 07/13/13 0138  NA 138 141  K 3.8 3.8  CL 104 105  CO2 24  --   GLUCOSE 79 79  BUN 15 16  CREATININE 0.75 0.90  CALCIUM 9.1  --    No results found for this basename: AST, ALT, ALKPHOS, BILITOT, PROT, ALBUMIN,  in the last 72 hours No results  found for this basename: LIPASE, AMYLASE,  in the last 72 hours  Recent Labs  07/13/13 0113 07/13/13 0138  WBC 8.6  --   HGB 12.0 12.2  HCT 35.7* 36.0  MCV 91.3  --   PLT 208  --    No results found for this basename: CKTOTAL, CKMB, CKMBINDEX, TROPONINI,  in the last 72 hours No components found with this basename: POCBNP,   Recent Labs  07/12/13 2138  DDIMER 1.13*   No results found for this basename: HGBA1C,  in the last 72 hours No results found for this basename: CHOL, HDL, LDLCALC, TRIG, CHOLHDL, LDLDIRECT,  in the last 72 hours No results found for this basename: TSH, T4TOTAL, FREET3, T3FREE, THYROIDAB,  in the last 72 hours No results found for this basename: VITAMINB12, FOLATE, FERRITIN, TIBC, IRON, RETICCTPCT,  in the last 72 hours Imaging results:  Dg Lumbar Spine 2-3 Views  06/22/2013   *RADIOLOGY REPORT*  Clinical Data: Low back pain into the right leg with numbness and tingling off and on since February.  No known injury  LUMBAR SPINE - 2-3 VIEW  Comparison: CT of the abdomen pelvis and 2009  Findings: There are five lumbar type vertebral bodies.  Vertebral body heights and disc spaces appear maintained.  Bony alignment appears intact.  No significant degenerative change is identified. No worrisome focal bony abnormality is noted.  The sacral white lines and sacroiliac joints appear maintained.  IMPRESSION: No focal abnormality identified   Original Report Authenticated By: Rhodia Albright, M.D.   Ct Angio Chest Pe W/cm &/or Wo Cm  07/13/2013   *RADIOLOGY REPORT*  Clinical Data: Chest pain and elevated D-dimer.  CT ANGIOGRAPHY CHEST  Technique:  Multidetector CT imaging of the chest using the standard protocol during bolus administration of intravenous contrast. Multiplanar reconstructed images including MIPs were obtained and reviewed to evaluate the vascular anatomy.  Contrast: OMNIPAQUE IOHEXOL 350 MG/ML SOLN  Comparison: None.  Findings: The study is positive for  bilateral pulmonary emboli. There are pulmonary emboli within the medial lower lobe subsegmental branches, left side greater than right.  Filling defects involving right upper lobe branches on sequence 7, image number 148.  Largest clot burden is involving the medial left lower lobe.  No significant pericardial or pleural fluid.  No evidence for interventricular deviation or strain.  There is a trace amount of left pleural fluid.  The trachea and mainstem bronchi are patent.  Hazy densities in the posterior lower lobes bilaterally may represent atelectasis.  The upper lungs are clear.  No acute bony abnormality.  IMPRESSION: Study is positive for bilateral small pulmonary emboli.  Largest clot burden is in the medial left lower lobe.  Critical Value/emergent results were called by telephone at the time of interpretation on 07/13/2013 at 01:06 a.m. to Dr. Blinda Leatherwood, who verbally acknowledged these results.  Original Report Authenticated By: Richarda Overlie, M.D.   Mr Lumbar Spine Wo Contrast  06/29/2013   *RADIOLOGY REPORT*  Clinical Data: Lumbosacral neuritis.  Degenerative lumbosacral intervertebral disc.  Low back pain extending into the right lower extremity.  MRI LUMBAR SPINE WITHOUT CONTRAST  Technique:  Multiplanar and multiecho pulse sequences of the lumbar spine were obtained without intravenous contrast.  Comparison: Lumbar spine radiographs 06/22/2013.  Findings: Normal signal is present in the conus medullaris which terminates L1-2.  Schmorl's nodes are present in the lower thoracic and upper lumbar spine.  Vertebral body heights and alignment are otherwise maintained.  Limited imaging of the abdomen is unremarkable.  L1-2:  A slight right paracentral disc protrusion is present without significant stenosis.  Mild facet hypertrophy is present.  L2-3:  Negative.  L3-4:  Negative.  L4-5:  Mild facet hypertrophy and slight disc bulging is present. There is no significant stenosis.  L5-S1:  Negative.  IMPRESSION:   1.  Slight right paracentral disc protrusion at L1-2 without significant stenosis. 2.  Minimal disc bulging and facet hypertrophy at L4-5 without significant stenosis.   Original Report Authenticated By: Marin Roberts, M.D.   Other results: EKG: normal EKG, normal sinus rhythm.  Assessment & Plan by Problem: Bilateral small pulmonary emboli Patient is a smoker and uses contraceptives which puts her at high risk for developing thromboembolic events.  Do not see the need for a hypercoagulable workup as this is patient's first episode and has risk factors including tobacco use and contraceptive use.  Patient is hemodynamically stable and is maintaining saturation above 98% on room air.  She was given a dose of therapeutic Lovenox in the emergency department. Discussed with the patient about Rivaroxaban as an alternative to Lovenox/Coumadin.  Patient agreeable.  She will be given a prescription for Rivaroxaban 15 mg twice daily for 21 days then 20 mg daily thereafter for a total of 51 days, she was instructed to followup with her primary care physician for refills.  Patient has been instructed to stop smoking and discontinue birth control medications.  She and her husband were instructed on using barrier contraceptives. She expressed understanding.  Mid back pain Musculoskeletal versus possible pleuritic pain from pulmonary embolism.  Controlled with pain medications to be prescribed by the ED provider at the time of discharge.  Tobacco abuse Counseled on cessation.  Pleuritic pain Likely due to pulmonary embolism.  Pain control as indicated above.  Chronic low back pain Management as indicated above with pain control.  All questions were answered from patient and husband.  Both expressed understanding the above instructions.  Patient will call to followup with her primary care physician in the next few days.  Recommendations were discussed with ED provider Ivonne Andrew, PA, agreeable for  discharge.  Marios Gaiser A, MD 07/13/2013, 2:56 AM

## 2013-07-13 NOTE — ED Provider Notes (Signed)
[name] S 12:00 AM patient discussed and signed out with Rhea Bleacher PA-C.  Patient presenting with history of chronic back pain with new mid back pain slightly pleuritic. No shortness of breath or tachycardia. Patient is a smoker and uses birth control. D-dimer was obtained and shown to be elevated. CT [place] ordered to rule out possible PE.  Urine pregnancy was ordered however patient was already being taken to CT scan. I spoke with patient about this. She reports having an MRI of her lower back 2 weeks ago. She states that she is not pregnant and does not wish to have the test performed now that she is back from CT. Pregnancy test was canceled. Recent does state she is told what this time and does not request any medications.  1:12AM CT has demonstrated positive for PE. Lovenox ordered. Will order basic labs and plan for admission.  2:00AM Spoke with [facility] hospitalist.  They will see pt.  They may consider D/C on Xeralto with PCP follow up.  Dr. Betti Cruz with [facility] will plan to D/C pt.  He will write Rx for Xeralto.  He is requesting that I give Rx to help treat pts pain.  Pt will follow up with her PCP and agrees with plan.  She had improvement with oxycodone and I will write for the same.  Angus Seller, PA-C 07/13/13 0222  Angus Seller, PA-C 07/13/13 [phone]

## 2013-07-13 NOTE — ED Provider Notes (Signed)
Medical screening examination/treatment/procedure(s) were performed by non-physician practitioner and as supervising physician I was immediately available for consultation/collaboration.    Christopher J. Pollina, MD 07/13/13 1613 

## 2013-07-14 ENCOUNTER — Telehealth: Payer: Self-pay | Admitting: Adult Health

## 2013-07-14 NOTE — ED Provider Notes (Signed)
Medical screening examination/treatment/procedure(s) were performed by non-physician practitioner and as supervising physician I was immediately available for consultation/collaboration.    Brandt Loosen, MD 07/14/13 [phone]

## 2013-07-14 NOTE — Telephone Encounter (Signed)
Was diagnosised  with PE in ER, off OCs discussed IUD or BTL and ablation will think about it and will use condoms for now.

## 2013-09-13 ENCOUNTER — Encounter: Payer: Self-pay | Admitting: Adult Health

## 2013-09-13 ENCOUNTER — Ambulatory Visit (INDEPENDENT_AMBULATORY_CARE_PROVIDER_SITE_OTHER): Payer: 59 | Admitting: Adult Health

## 2013-09-13 VITALS — BP 90/58 | HR 72 | Ht 68.0 in | Wt 171.5 lb

## 2013-09-13 DIAGNOSIS — Z86711 Personal history of pulmonary embolism: Secondary | ICD-10-CM

## 2013-09-13 DIAGNOSIS — Z01419 Encounter for gynecological examination (general) (routine) without abnormal findings: Secondary | ICD-10-CM

## 2013-09-13 DIAGNOSIS — N920 Excessive and frequent menstruation with regular cycle: Secondary | ICD-10-CM

## 2013-09-13 HISTORY — DX: Excessive and frequent menstruation with regular cycle: N92.0

## 2013-09-13 NOTE — Patient Instructions (Addendum)
Endometrial Ablation Endometrial ablation removes the lining of the uterus (endometrium). It is usually a same day, outpatient treatment. Ablation helps avoid major surgery (such as a hysterectomy). A hysterectomy is removal of the cervix and uterus. Endometrial ablation has less risk and complications, has a shorter recovery period and is less expensive. After endometrial ablation, most women will have little or no menstrual bleeding. You may not keep your fertility. Pregnancy is no longer likely after this procedure but if you are pre-menopausal, you still need to use a reliable method of birth control following the procedure because pregnancy can occur. REASONS TO HAVE THE PROCEDURE MAY INCLUDE:  Heavy periods.  Bleeding that is causing anemia.  Anovulatory bleeding, very irregular, bleeding.  Bleeding submucous fibroids (on the lining inside the uterus) if they are smaller than 3 centimeters. REASONS NOT TO HAVE THE PROCEDURE MAY INCLUDE:  You wish to have more children.  You have a pre-cancerous or cancerous problem. The cause of any abnormal bleeding must be diagnosed before having the procedure.  You have pain coming from the uterus.  You have a submucus fibroid larger than 3 centimeters.  You recently had a baby.  You recently had an infection in the uterus.  You have a severe retro-flexed, tipped uterus and cannot insert the instrument to do the ablation.  You had a Cesarean section or deep major surgery on the uterus.  The inner cavity of the uterus is too large for the endometrial ablation instrument. RISKS AND COMPLICATIONS   Perforation of the uterus.  Bleeding.  Infection of the uterus, bladder or vagina.  Injury to surrounding organs.  Cutting the cervix.  An air bubble to the lung (air embolus).  Pregnancy following the procedure.  Failure of the procedure to help the problem requiring hysterectomy.  Decreased ability to diagnose cancer in the lining of  the uterus. BEFORE THE PROCEDURE  The lining of the uterus must be tested to make sure there is no pre-cancerous or cancer cells present.  Medications may be given to make the lining of the uterus thinner.  Ultrasound may be used to evaluate the size and look for abnormalities of the uterus.  Future pregnancy is not desired. PROCEDURE  There are different ways to destroy the lining of the uterus.   Resectoscope - radio frequency-alternating electric current is the most common one used.  Cryotherapy - freezing the lining of the uterus.  Heated Free Liquid - heated salt (saline) solution inserted into the uterus.  Microwave - uses high energy microwaves in the uterus.  Thermal Balloon - a catheter with a balloon tip is inserted into the uterus and filled with heated fluid. Your caregiver will talk with you about the method used in this clinic. They will also instruct you on the pros and cons of the procedure. Endometrial ablation is performed along with a procedure called operative hysteroscopy. A narrow viewing tube is inserted through the birth canal (vagina) and through the cervix into the uterus. A tiny camera attached to the viewing tube (hysteroscope) allows the uterine cavity to be shown on a TV monitor during surgery. Your uterus is filled with a harmless liquid to make the procedure easier. The lining of the uterus is then removed. The lining can also be removed with a resectoscope which allows your surgeon to cut away the lining of the uterus under direct vision. Usually, you will be able to go home within an hour after the procedure. HOME CARE INSTRUCTIONS   Do   not drive for 24 hours.  No tampons, douching or intercourse for 2 weeks or until your caregiver approves.  Rest at home for 24 to 48 hours. You may then resume normal activities unless told differently by your caregiver.  Take your temperature two times a day for 4 days, and record it.  Take any medications your  caregiver has ordered, as directed.  Use some form of contraception if you are pre-menopausal and do not want to get pregnant. Bleeding after the procedure is normal. It varies from light spotting and mildly watery to bloody discharge for 4 to 6 weeks. You may also have mild cramping. Only take over-the-counter or prescription medicines for pain, discomfort, or fever as directed by your caregiver. Do not use aspirin, as this may aggravate bleeding. Frequent urination during the first 24 hours is normal. You will not know how effective your surgery is until at least 3 months after the surgery. SEEK IMMEDIATE MEDICAL CARE IF:   Bleeding is heavier than a normal menstrual cycle.  An oral temperature above 102 F (38.9 C) develops.  You have increasing cramps or pains not relieved with medication or develop belly (abdominal) pain which does not seem to be related to the same area of earlier cramping and pain.  You are light headed, weak or have fainting episodes.  You develop pain in the shoulder strap areas.  You have chest or leg pain.  You have abnormal vaginal discharge.  You have painful urination. Document Released: 10/25/2004 Document Revised: 03/09/2012 Document Reviewed: 01/23/2008 [facility] Patient Information 2014 [place], [place]. Laparoscopic Tubal Ligation Laparoscopic tubal ligation is a procedure that closes the fallopian tubes at a time other than right after childbirth. By closing the fallopian tubes, the eggs that are released from the ovaries cannot enter the uterus and sperm cannot reach the egg. Tubal ligation is also known as getting your "tubes tied." Tubal ligation is done so you will not be able to get pregnant or have a baby.  Although this procedure may be reversed, it should be considered permanent and irreversible. If you want to have future pregnancies, you should not have this procedure.  LET YOUR CAREGIVER KNOW ABOUT:  Allergies to food or  medicine.  Medicines taken, including vitamins, herbs, eyedrops, over-the-counter medicines, and creams.  Use of steroids (by mouth or creams).  Previous problems with numbing medicines.  History of bleeding problems or blood clots.  Any recent colds or infections.  Previous surgery.  Other health problems, including diabetes and kidney problems.  Possibility of pregnancy, if this applies.  Any past pregnancies. RISKS AND COMPLICATIONS   Infection.  Bleeding.  Injury to surrounding organs.  Anesthetic side effects.  Failure of the procedure.  Ectopic pregnancy.  Future regret about having the procedure done. BEFORE THE PROCEDURE  Do not take aspirin or blood thinners a week before the procedure or as directed. This can cause bleeding.  Do not eat or drink anything 6 to 8 hours before the procedure. PROCEDURE   You may be given a medicine to help you relax (sedative) before the procedure. You will be given a medicine to make you sleep (general anesthetic) during the procedure.  A tube will be put down your throat to help your breath while under general anesthesia.  Two small cuts (incisions) are made in the lower abdominal area and near the belly button.  Your abdominal area will be inflated with a safe gas (carbon dioxide). This helps give the surgeon room to  operate, visualize, and helps the surgeon avoid other organs.  A thin, lighted tube (laparoscope) with a camera attached is inserted into your abdomen through one of the incisions near the belly button. Other small instruments are also inserted through the other abdominal incision.  The fallopian tubes are located and are either blocked with a ring, clip, or are burned (cauterized).  After the fallopian tubes are blocked, the gas is released from the abdomen.  The incisions will be closed with stitches (sutures), and a bandage may be placed over the incisions. AFTER THE PROCEDURE   You will rest in a  recovery room for 1 4 hours until you are stable and doing well.  You will also have some mild abdominal discomfort for 3 7 days. You will be given pain medicine to ease any discomfort.  As long as there are no problems, you may be allowed to go home. Someone will need to drive you home and be with you for at least 24 hours once home.  You may have some mild discomfort in the throat. This is from the tube placed in your throat while you were sleeping.  You may experience discomfort in the shoulder area from some trapped air between the liver and diaphragm. This sensation is normal and will slowly go away on its own. Document Released: 03/24/2001 Document Revised: 06/16/2012 Document Reviewed: 03/28/2012 [facility] Patient Information 2014 [place], [place]. Menorrhagia Dysfunctional uterine bleeding is different from a normal menstrual period. When periods are heavy or there is more bleeding than is usual for you, it is called menorrhagia. It may be caused by hormonal imbalance, or physical, metabolic, or other problems. Examination is necessary in order that your caregiver may treat treatable causes. If this is a continuing problem, a D&C may be needed. That means that the cervix (the opening of the uterus or womb) is dilated (stretched larger) and the lining of the uterus is scraped out. The tissue scraped out is then examined under a microscope by a specialist (pathologist) to make sure there is nothing of concern that needs further or more extensive treatment. HOME CARE INSTRUCTIONS   If medications were prescribed, take exactly as directed. Do not change or switch medications without consulting your caregiver.  Long term heavy bleeding may result in iron deficiency. Your caregiver may have prescribed iron pills. They help replace the iron your body lost from heavy bleeding. Take exactly as directed. Iron may cause constipation. If this becomes a problem, increase the bran, fruits, and roughage in  your diet.  Do not take aspirin or medicines that contain aspirin one week before or during your menstrual period. Aspirin may make the bleeding worse.  If you need to change your sanitary pad or tampon more than once every 2 hours, stay in bed and rest as much as possible until the bleeding stops.  Eat well-balanced meals. Eat foods high in iron. Examples are leafy green vegetables, meat, liver, eggs, and whole grain breads and cereals. Do not try to lose weight until the abnormal bleeding has stopped and your blood iron level is back to normal. SEEK MEDICAL CARE IF:   You need to change your sanitary pad or tampon more than once an hour.  You develop nausea (feeling sick to your stomach) and vomiting, dizziness, or diarrhea while you are taking your medicine.  You have any problems that may be related to the medicine you are taking. SEEK IMMEDIATE MEDICAL CARE IF:   You have a fever.  You develop chills.  You develop severe bleeding or start to pass blood clots.  You feel dizzy or faint. MAKE SURE YOU:   Understand these instructions.  Will watch your condition.  Will get help right away if you are not doing well or get worse. Document Released: 12/16/2005 Document Revised: 03/09/2012 Document Reviewed: 08/05/2008 [facility] Patient Information 2014 [place], [place]. Get mammogram Physical in 1 year Return in 1 week for pre op

## 2013-09-13 NOTE — Progress Notes (Signed)
Patient ID: [name], female   DOB: [birth year], 37 y.o.   MRN: [id number] History of Present Illness: [name] is a 37 year old white female married in for a physical.She had a PE this year and is off her OCs and is having heavy periods and does not like it.She is on Xarelto. She had a normal pap and negative HPV 08/2012.  Current Medications, Allergies, Past Medical History, Past Surgical History, Family History and Social History were reviewed in [employer] record.     Review of Systems: Patient denies any headaches, blurred vision, shortness of breath, chest pain, abdominal pain, problems with bowel movements, urination, or intercourse. No joint pain or mood swings.Positives in HPI.    Physical Exam:BP 90/58  Pulse 72  Ht 5\' 8"  (1.727 m)  Wt 171 lb 8 oz (77.792 kg)  BMI 26.08 kg/m2  LMP 09/05/2014She has lost 20+pounds from last year. General:  Well developed, well nourished, no acute distress Skin:  Warm and dry Neck:  Midline trachea, normal thyroid Lungs; Clear to auscultation bilaterally Breast:  No dominant palpable mass, retraction, or nipple discharge Cardiovascular: Regular rate and rhythm Abdomen:  Soft, non tender, no hepatosplenomegaly Pelvic:  External genitalia is normal in appearance.  The vagina is normal in appearance.  The cervix is bulbous.  Uterus is felt to be normal size, shape, and contour.  No adnexal masses or tenderness noted. Extremities:  No swelling or varicosities noted Psych:  Alert and cooperative seems happy   Impression: Yearly exam-no pap Menorrhagia History of PE Strong family history of breast cancer   Plan: Physical in 1 year  Mammogram yearly Review handouts on menorrhagia,BTL, and endoablation Return in 1 week for pre op with Dr Despina Hidden

## 2013-09-21 ENCOUNTER — Encounter: Payer: 59 | Admitting: Obstetrics & Gynecology

## 2013-09-22 ENCOUNTER — Ambulatory Visit (INDEPENDENT_AMBULATORY_CARE_PROVIDER_SITE_OTHER): Payer: 59 | Admitting: Obstetrics & Gynecology

## 2013-09-22 ENCOUNTER — Encounter: Payer: Self-pay | Admitting: Obstetrics & Gynecology

## 2013-09-22 VITALS — BP 100/60 | Ht 68.0 in | Wt 172.0 lb

## 2013-09-22 DIAGNOSIS — N920 Excessive and frequent menstruation with regular cycle: Secondary | ICD-10-CM

## 2013-09-22 DIAGNOSIS — N946 Dysmenorrhea, unspecified: Secondary | ICD-10-CM

## 2013-09-22 NOTE — Progress Notes (Signed)
Patient ID: [name], female   DOB: 08-Jul-1976, 37 y.o.   MRN: [id number] Preoperative History and Physical  [name] is a 37 y.o. [id number] with Patient's last menstrual period was 09/03/2013. admitted for a hysteroscopy uterine curettage and endometrial ablation with a laparoscopic bilateral tubal ligation. Periods are very heavy and quite painful, wants semi conservative definitive treatment   PMH:    Past Medical History  Diagnosis Date  . Chronic low back pain   . Pulmonary embolism   . Menorrhagia 09/13/2013    PSH:     Past Surgical History  Procedure Laterality Date  . Appendectomy      POb/GynH:      OB History   Grav Para Term Preterm Abortions TAB SAB Ect Mult Living   3 2   1  1   2       SH:   History  Substance Use Topics  . Smoking status: Former Games developer  . Smokeless tobacco: Never Used  . Alcohol Use: Yes     Comment: socially    FH:    Family History  Problem Relation Age of Onset  . Breast cancer Mother   . COPD Mother   . Heart Problems Father   . Cancer Maternal Grandmother     breast  . Breast cancer Maternal Aunt   . Cancer Maternal Aunt     ovarian     Allergies: No Known Allergies  Medications:      Current outpatient prescriptions:cyclobenzaprine (FLEXERIL) 10 MG tablet, Take 10 mg by mouth 3 (three) times daily. Take for 10 days, Disp: , Rfl: ;  oxyCODONE-acetaminophen (PERCOCET/ROXICET) 5-325 MG per tablet, Take 1-2 tablets by mouth every 6 (six) hours as needed for pain., Disp: 20 tablet, Rfl: 0 Rivaroxaban (XARELTO) 20 MG TABS, Take 1 tablet (20 mg total) by mouth daily with supper. Begin when 15 mg twice daily treatment (21 days total) is complete., Disp: 30 tablet, Rfl: 0;  traMADol-acetaminophen (ULTRACET) 37.5-325 MG per tablet, , Disp: , Rfl:   Review of Systems:   Review of Systems  Constitutional: Negative for fever, chills, weight loss, malaise/fatigue and diaphoresis.  HENT: Negative for hearing loss, ear pain,  nosebleeds, congestion, sore throat, neck pain, tinnitus and ear discharge.   Eyes: Negative for blurred vision, double vision, photophobia, pain, discharge and redness.  Respiratory: Negative for cough, hemoptysis, sputum production, shortness of breath, wheezing and stridor.   Cardiovascular: Negative for chest pain, palpitations, orthopnea, claudication, leg swelling and PND.  Gastrointestinal: Positive for abdominal pain. Negative for heartburn, nausea, vomiting, diarrhea, constipation, blood in stool and melena.  Genitourinary: Negative for dysuria, urgency, frequency, hematuria and flank pain.  Musculoskeletal: Negative for myalgias, back pain, joint pain and falls.  Skin: Negative for itching and rash.  Neurological: Negative for dizziness, tingling, tremors, sensory change, speech change, focal weakness, seizures, loss of consciousness, weakness and headaches.  Endo/Heme/Allergies: Negative for environmental allergies and polydipsia. Does not bruise/bleed easily.  Psychiatric/Behavioral: Negative for depression, suicidal ideas, hallucinations, memory loss and substance abuse. The patient is not nervous/anxious and does not have insomnia.      PHYSICAL EXAM:  Blood pressure 100/60, height 5\' 8"  (1.727 m), weight 172 lb (78.019 kg), last menstrual period 09/03/2013.    Vitals reviewed. Constitutional: She is oriented to person, place, and time. She appears well-developed and well-nourished.  HENT:  Head: Normocephalic and atraumatic.  Right Ear: External ear normal.  Left Ear: External ear normal.  Nose: Nose normal.  Mouth/Throat: Oropharynx is clear and moist.  Eyes: Conjunctivae and EOM are normal. Pupils are equal, round, and reactive to light. Right eye exhibits no discharge. Left eye exhibits no discharge. No scleral icterus.  Neck: Normal range of motion. Neck supple. No tracheal deviation present. No thyromegaly present.  Cardiovascular: Normal rate, regular rhythm, normal  heart sounds and intact distal pulses.  Exam reveals no gallop and no friction rub.   No murmur heard. Respiratory: Effort normal and breath sounds normal. No respiratory distress. She has no wheezes. She has no rales. She exhibits no tenderness.  GI: Soft. Bowel sounds are normal. She exhibits no distension and no mass. There is tenderness. There is no rebound and no guarding.  Genitourinary:       Vulva is normal without lesions Vagina is pink moist without discharge Cervix normal in appearance and pap is normal Uterus is normal size shape and contour Adnexa is negative with normal sized ovaries by sonogram  Musculoskeletal: Normal range of motion. She exhibits no edema and no tenderness.  Neurological: She is alert and oriented to person, place, and time. She has normal reflexes. She displays normal reflexes. No cranial nerve deficit. She exhibits normal muscle tone. Coordination normal.  Skin: Skin is warm and dry. No rash noted. No erythema. No pallor.  Psychiatric: She has a normal mood and affect. Her behavior is normal. Judgment and thought content normal.    Labs: No results found for this or any previous visit (from the past 336 hour(s)).  EKG: Orders placed during the hospital encounter of 07/12/13  . EKG 12-LEAD  . EKG 12-LEAD    Imaging Studies: No results found.    Assessment: Patient Active Problem List   Diagnosis Date Noted  . Menorrhagia 09/13/2013  . Mid back pain 07/13/2013  . Pulmonary embolism 07/13/2013  . Pleuritic pain 07/13/2013  . Tobacco abuse 07/13/2013    Plan: As above laparoscopic BTL with hysteroscopy uterine curettage and endometrial abalation  EURE,LUTHER H 09/22/2013 4:45 PM

## 2013-09-23 ENCOUNTER — Encounter (HOSPITAL_COMMUNITY): Payer: Self-pay | Admitting: Pharmacy Technician

## 2013-09-24 ENCOUNTER — Encounter (HOSPITAL_COMMUNITY): Payer: Self-pay | Admitting: Pharmacy Technician

## 2013-09-27 NOTE — Patient Instructions (Addendum)
MADINAH QUARRY  09/27/2013   Your procedure is scheduled on:  10/01/13  Report to [facility] [place] [facility].  Call this number if you have problems the morning of surgery: [phone]   Remember:   Do not eat food or drink liquids after midnight.   Take these medicines the morning of surgery with A SIP OF WATER: none   Do not wear jewelry, make-up or nail polish.  Do not wear lotions, powders, or perfumes. You may wear deodorant.  Do not shave 48 hours prior to surgery. Men may shave face and neck.  Do not bring valuables to the hospital.  [facility] is not responsible                  for any belongings or valuables.               Contacts, dentures or bridgework may not be worn into surgery.  Leave suitcase in the car. After surgery it may be brought to your room.  For patients admitted to the hospital, discharge time is determined by your                treatment team.               Patients discharged the day of surgery will not be allowed to drive  home.  Name and phone number of your driver: family  Special Instructions: Shower using CHG 2 nights before surgery and the night before surgery.  If you shower the day of surgery use CHG.  Use special wash - you have one bottle of CHG for all showers.  You should use approximately 1/3 of the bottle for each shower.   Please read over the following fact sheets that you were given: Pain Booklet, Surgical Site Infection Prevention, Anesthesia Post-op Instructions and Care and Recovery After Surgery   PATIENT INSTRUCTIONS POST-ANESTHESIA  IMMEDIATELY FOLLOWING SURGERY:  Do not drive or operate machinery for the first twenty four hours after surgery.  Do not make any important decisions for twenty four hours after surgery or while taking narcotic pain medications or sedatives.  If you develop intractable nausea and vomiting or a severe headache please notify your doctor immediately.  FOLLOW-UP:  Please make an appointment with your surgeon as  instructed. You do not need to follow up with anesthesia unless specifically instructed to do so.  WOUND CARE INSTRUCTIONS (if applicable):  Keep a dry clean dressing on the anesthesia/puncture wound site if there is drainage.  Once the wound has quit draining you may leave it open to air.  Generally you should leave the bandage intact for twenty four hours unless there is drainage.  If the epidural site drains for more than 36-48 hours please call the anesthesia department.  QUESTIONS?:  Please feel free to call your physician or the hospital operator if you have any questions, and they will be happy to assist you.       Hysteroscopy Hysteroscopy is a procedure used for looking inside the womb (uterus). It may be done for many different reasons, including:  To evaluate abnormal bleeding, fibroid (benign, noncancerous) tumors, polyps, scar tissue (adhesions), and possibly cancer of the uterus.  To look for lumps (tumors) and other uterine growths.  To look for causes of why a woman cannot get pregnant (infertility), causes of recurrent loss of pregnancy (miscarriages), or a lost intrauterine device (IUD).  To perform a sterilization by blocking the fallopian tubes from inside  the uterus. A hysteroscopy should be done right after a menstrual period to be sure you are not pregnant. LET YOUR CAREGIVER KNOW ABOUT:   Allergies.  Medicines taken, including herbs, eyedrops, over-the-counter medicines, and creams.  Use of steroids (by mouth or creams).  Previous problems with anesthetics or numbing medicines.  History of bleeding or blood problems.  History of blood clots.  Possibility of pregnancy, if this applies.  Previous surgery.  Other health problems. RISKS AND COMPLICATIONS   Putting a hole in the uterus.  Excessive bleeding.  Infection.  Damage to the cervix.  Injury to other organs.  Allergic reaction to medicines.  Too much fluid used in the uterus for the  procedure. BEFORE THE PROCEDURE   Do not take aspirin or blood thinners for a week before the procedure, or as directed. It can cause bleeding.  Arrive at least 60 minutes before the procedure or as directed to read and sign the necessary forms.  Arrange for someone to take you home after the procedure.  If you smoke, do not smoke for 2 weeks before the procedure. PROCEDURE   Your caregiver may give you medicine to relax you. He or she may also give you a medicine that numbs the area around the cervix (local anesthetic) or a medicine that makes you sleep (general anesthesia).  Sometimes, a medicine is placed in the cervix the day before the procedure. This medicine makes the cervix have a larger opening (dilate). This makes it easier for the instrument to be inserted into the uterus.  A small instrument (hysteroscope) is inserted through the vagina into the uterus. This instrument is similar to a pencil-sized telescope with a light.  During the procedure, air or a liquid is put into the uterus, which allows the surgeon to see better.  Sometimes, tissue is gently scraped from inside the uterus. These tissue samples are sent to a specialist who looks at tissue samples (pathologist). The pathologist will give a report to your caregiver. This will help your caregiver decide if further treatment is necessary. The report will also help your caregiver decide on the best treatment if the test comes back abnormal. AFTER THE PROCEDURE   If you had a general anesthetic, you may be groggy for a couple hours after the procedure.  If you had a local anesthetic, you will be advised to rest at the surgical center or caregiver's office until you are stable and feel ready to go home.  You may have some cramping for a couple days.  You may have bleeding, which varies from light spotting for a few days to menstrual-like bleeding for up to 3 to 7 days. This is normal.  Have someone take you home. FINDING  OUT THE RESULTS OF YOUR TEST Not all test results are available during your visit. If your test results are not back during the visit, make an appointment with your caregiver to find out the results. Do not assume everything is normal if you have not heard from your caregiver or the medical facility. It is important for you to follow up on all of your test results. HOME CARE INSTRUCTIONS   Do not drive for 24 hours or as instructed.  Only take over-the-counter or prescription medicines for pain, discomfort, or fever as directed by your caregiver.  Do not take aspirin. It can cause or aggravate bleeding.  Do not drive or drink alcohol while taking pain medicine.  You may resume your usual diet.  Do not  use tampons, douche, or have sexual intercourse for 2 weeks, or as advised by your caregiver.  Rest and sleep for the first 24 to 48 hours.  Take your temperature twice a day for 4 to 5 days. Write it down. Give these temperatures to your caregiver if they are abnormal (above 98.6 F or 37.0 C).  Take medicines your caregiver has ordered as directed.  Follow your caregiver's advice regarding diet, exercise, lifting, driving, and general activities.  Take showers instead of baths for 2 weeks, or as recommended by your caregiver.  If you develop constipation:  Take a mild laxative with the advice of your caregiver.  Eat bran foods.  Drink enough water and fluids to keep your urine clear or pale yellow.  Try to have someone with you or available to you for the first 24 to 48 hours, especially if you had a general anesthetic.  Make sure you and your family understand everything about your operation and recovery.  Follow your caregiver's advice regarding follow-up appointments and Pap smears. SEEK MEDICAL CARE IF:   You feel dizzy or lightheaded.  You feel sick to your stomach (nauseous).  You develop abnormal vaginal discharge.  You develop a rash.  You have an abnormal  reaction or allergy to your medicine.  You need stronger pain medicine. SEEK IMMEDIATE MEDICAL CARE IF:   Bleeding is heavier than a normal menstrual period or you have blood clots.  You have an oral temperature above 102 F (38.9 C), not controlled by medicine.  You have increasing cramps or pains not relieved with medicine.  You develop belly (abdominal) pain that does not seem to be related to the same area of earlier cramping and pain.  You pass out.  You develop pain in the tops of your shoulders (shoulder strap areas).  You develop shortness of breath. MAKE SURE YOU:   Understand these instructions.  Will watch your condition.  Will get help right away if you are not doing well or get worse. Document Released: 03/24/2001 Document Revised: 03/09/2012 Document Reviewed: 07/17/2009 [facility] Patient Information 2014 [place], [place]. Endometrial Ablation Endometrial ablation removes the lining of the uterus (endometrium). It is usually a same day, outpatient treatment. Ablation helps avoid major surgery (such as a hysterectomy). A hysterectomy is removal of the cervix and uterus. Endometrial ablation has less risk and complications, has a shorter recovery period and is less expensive. After endometrial ablation, most women will have little or no menstrual bleeding. You may not keep your fertility. Pregnancy is no longer likely after this procedure but if you are pre-menopausal, you still need to use a reliable method of birth control following the procedure because pregnancy can occur. REASONS TO HAVE THE PROCEDURE MAY INCLUDE:  Heavy periods.  Bleeding that is causing anemia.  Anovulatory bleeding, very irregular, bleeding.  Bleeding submucous fibroids (on the lining inside the uterus) if they are smaller than 3 centimeters. REASONS NOT TO HAVE THE PROCEDURE MAY INCLUDE:  You wish to have more children.  You have a pre-cancerous or cancerous problem. The cause of any  abnormal bleeding must be diagnosed before having the procedure.  You have pain coming from the uterus.  You have a submucus fibroid larger than 3 centimeters.  You recently had a baby.  You recently had an infection in the uterus.  You have a severe retro-flexed, tipped uterus and cannot insert the instrument to do the ablation.  You had a Cesarean section or deep major surgery on  the uterus.  The inner cavity of the uterus is too large for the endometrial ablation instrument. RISKS AND COMPLICATIONS   Perforation of the uterus.  Bleeding.  Infection of the uterus, bladder or vagina.  Injury to surrounding organs.  Cutting the cervix.  An air bubble to the lung (air embolus).  Pregnancy following the procedure.  Failure of the procedure to help the problem requiring hysterectomy.  Decreased ability to diagnose cancer in the lining of the uterus. BEFORE THE PROCEDURE  The lining of the uterus must be tested to make sure there is no pre-cancerous or cancer cells present.  Medications may be given to make the lining of the uterus thinner.  Ultrasound may be used to evaluate the size and look for abnormalities of the uterus.  Future pregnancy is not desired. PROCEDURE  There are different ways to destroy the lining of the uterus.   Resectoscope - radio frequency-alternating electric current is the most common one used.  Cryotherapy - freezing the lining of the uterus.  Heated Free Liquid - heated salt (saline) solution inserted into the uterus.  Microwave - uses high energy microwaves in the uterus.  Thermal Balloon - a catheter with a balloon tip is inserted into the uterus and filled with heated fluid. Your caregiver will talk with you about the method used in this clinic. They will also instruct you on the pros and cons of the procedure. Endometrial ablation is performed along with a procedure called operative hysteroscopy. A narrow viewing tube is inserted  through the birth canal (vagina) and through the cervix into the uterus. A tiny camera attached to the viewing tube (hysteroscope) allows the uterine cavity to be shown on a TV monitor during surgery. Your uterus is filled with a harmless liquid to make the procedure easier. The lining of the uterus is then removed. The lining can also be removed with a resectoscope which allows your surgeon to cut away the lining of the uterus under direct vision. Usually, you will be able to go home within an hour after the procedure. HOME CARE INSTRUCTIONS   Do not drive for 24 hours.  No tampons, douching or intercourse for 2 weeks or until your caregiver approves.  Rest at home for 24 to 48 hours. You may then resume normal activities unless told differently by your caregiver.  Take your temperature two times a day for 4 days, and record it.  Take any medications your caregiver has ordered, as directed.  Use some form of contraception if you are pre-menopausal and do not want to get pregnant. Bleeding after the procedure is normal. It varies from light spotting and mildly watery to bloody discharge for 4 to 6 weeks. You may also have mild cramping. Only take over-the-counter or prescription medicines for pain, discomfort, or fever as directed by your caregiver. Do not use aspirin, as this may aggravate bleeding. Frequent urination during the first 24 hours is normal. You will not know how effective your surgery is until at least 3 months after the surgery. SEEK IMMEDIATE MEDICAL CARE IF:   Bleeding is heavier than a normal menstrual cycle.  An oral temperature above 102 F (38.9 C) develops.  You have increasing cramps or pains not relieved with medication or develop belly (abdominal) pain which does not seem to be related to the same area of earlier cramping and pain.  You are light headed, weak or have fainting episodes.  You develop pain in the shoulder strap areas.  You  have chest or leg  pain.  You have abnormal vaginal discharge.  You have painful urination. Document Released: 10/25/2004 Document Revised: 03/09/2012 Document Reviewed: 01/23/2008 [facility] Patient Information 2014 [place], [place]. Laparoscopic Tubal Ligation Laparoscopic tubal ligation is a procedure that closes the fallopian tubes at a time other than right after childbirth. By closing the fallopian tubes, the eggs that are released from the ovaries cannot enter the uterus and sperm cannot reach the egg. Tubal ligation is also known as getting your "tubes tied." Tubal ligation is done so you will not be able to get pregnant or have a baby.  Although this procedure may be reversed, it should be considered permanent and irreversible. If you want to have future pregnancies, you should not have this procedure.  LET YOUR CAREGIVER KNOW ABOUT:  Allergies to food or medicine.  Medicines taken, including vitamins, herbs, eyedrops, over-the-counter medicines, and creams.  Use of steroids (by mouth or creams).  Previous problems with numbing medicines.  History of bleeding problems or blood clots.  Any recent colds or infections.  Previous surgery.  Other health problems, including diabetes and kidney problems.  Possibility of pregnancy, if this applies.  Any past pregnancies. RISKS AND COMPLICATIONS   Infection.  Bleeding.  Injury to surrounding organs.  Anesthetic side effects.  Failure of the procedure.  Ectopic pregnancy.  Future regret about having the procedure done. BEFORE THE PROCEDURE  Do not take aspirin or blood thinners a week before the procedure or as directed. This can cause bleeding.  Do not eat or drink anything 6 to 8 hours before the procedure. PROCEDURE   You may be given a medicine to help you relax (sedative) before the procedure. You will be given a medicine to make you sleep (general anesthetic) during the procedure.  A tube will be put down your throat to help  your breath while under general anesthesia.  Two small cuts (incisions) are made in the lower abdominal area and near the belly button.  Your abdominal area will be inflated with a safe gas (carbon dioxide). This helps give the surgeon room to operate, visualize, and helps the surgeon avoid other organs.  A thin, lighted tube (laparoscope) with a camera attached is inserted into your abdomen through one of the incisions near the belly button. Other small instruments are also inserted through the other abdominal incision.  The fallopian tubes are located and are either blocked with a ring, clip, or are burned (cauterized).  After the fallopian tubes are blocked, the gas is released from the abdomen.  The incisions will be closed with stitches (sutures), and a bandage may be placed over the incisions. AFTER THE PROCEDURE   You will rest in a recovery room for 1 4 hours until you are stable and doing well.  You will also have some mild abdominal discomfort for 3 7 days. You will be given pain medicine to ease any discomfort.  As long as there are no problems, you may be allowed to go home. Someone will need to drive you home and be with you for at least 24 hours once home.  You may have some mild discomfort in the throat. This is from the tube placed in your throat while you were sleeping.  You may experience discomfort in the shoulder area from some trapped air between the liver and diaphragm. This sensation is normal and will slowly go away on its own. Document Released: 03/24/2001 Document Revised: 06/16/2012 Document Reviewed: 03/28/2012 [employer] Patient Information  2014 [place], [place]. Dilation and Curettage or Vacuum Curettage Dilation and curettage (D&C) and vacuum curettage are minor procedures. A D&C involves stretching (dilation) the cervix and scraping (curettage) the inside lining of the womb (uterus). During a D&C, tissue is gently scraped from the inside lining of the uterus.  During a vacuum curettage, the lining and tissue in the uterus are removed with the use of gentle suction. Curettage may be performed for diagnostic or therapeutic purposes. As a diagnostic procedure, curettage is performed for the purpose of examining tissues from the uterus. Tissue examination may help determine causes or treatment options for symptoms. A diagnostic curettage may be performed for the following symptoms:  Irregular bleeding in the uterus.  Bleeding with the development of clots.  Spotting between menstrual periods.  Prolonged menstrual periods.  Bleeding after menopause.  No menstrual period (amenorrhea).  A change in size and shape of the uterus. A therapeutic curettage is performed to remove tissue, blood, or a contraceptive device. Therapeutic curettage may be performed for the following conditions:   Removal of an IUD (intrauterine device).  Removal of retained placenta after giving birth. Retained placenta can cause bleeding severe enough to require transfusions or an infection.  Abortion.  Miscarriage.  Removal of polyps inside the uterus.  Removal of uncommon types of fibroids (noncancerous lumps). LET YOUR CAREGIVER KNOW ABOUT:   Allergies to food or medicine.  Medicines taken, including vitamins, herbs, eyedrops, over-the-counter medicines, and creams.  Use of steroids (by mouth or creams).  Previous problems with anesthetics or numbing medicines.  History of bleeding problems or blood clots.  Previous surgery.  Other health problems, including diabetes and kidney problems.  Possibility of pregnancy, if this applies. RISKS AND COMPLICATIONS   Excessive bleeding.  Infection of the uterus.  Damage to the cervix.  Development of scar tissue (adhesions) inside the uterus, later causing abnormal amounts of menstrual bleeding.  Complications from the general anesthetic, if a general anesthetic is used.  Putting a hole (perforation) in the  uterus. This is rare. BEFORE THE PROCEDURE   Eat and drink before the procedure only as directed by your caregiver.  Arrange for someone to take you home. PROCEDURE   This procedure may be done in a hospital, outpatient clinic, or caregiver's office.  You may be given a general anesthetic or a local anesthetic in and around the cervix.  You will lie on your back with your legs in stirrups.  There are two ways in which your cervix can be softened and dilated. These include:  Taking a medicine.  Having thin rods (laminaria) inserted into your cervix.  A curved tool (curette) will scrape cells from the inside lining of the uterus and will then be removed. This procedure usually takes about 15 to 30 minutes. AFTER THE PROCEDURE   You will rest in the recovery area until you are stable and are ready to go home.  You will need to have someone take you home.  You may feel sick to your stomach (nauseous) or throw up (vomit) if you had general anesthesia.  You may have a sore throat if a tube was placed in your throat during general anesthesia.  You may have light cramping and bleeding for 2 days to 2 weeks after the procedure.  Your uterus needs to make a new lining after the procedure. This may make your next period late. Document Released: 12/16/2005 Document Revised: 03/09/2012 Document Reviewed: 07/14/2009 [facility] Patient Information 2014 [place], [place].

## 2013-09-28 ENCOUNTER — Other Ambulatory Visit (HOSPITAL_COMMUNITY): Payer: 59

## 2013-09-28 ENCOUNTER — Encounter (HOSPITAL_COMMUNITY)
Admission: RE | Admit: 2013-09-28 | Discharge: 2013-09-28 | Disposition: A | Discharge status: Home / Self Care | Payer: 59 | Source: Ambulatory Visit | Attending: Obstetrics & Gynecology | Admitting: Obstetrics & Gynecology

## 2013-09-28 ENCOUNTER — Encounter (HOSPITAL_COMMUNITY): Payer: Self-pay

## 2013-09-28 LAB — COMPREHENSIVE METABOLIC PANEL
BUN: 13 mg/dL (ref 6–23)
CO2: 27 mEq/L (ref 19–32)
Calcium: 9.3 mg/dL (ref 8.4–10.5)
Creatinine, Ser: 0.7 mg/dL (ref 0.50–1.10)
GFR calc Af Amer: >90 mL/min (ref >90)
GFR calc non Af Amer: >90 mL/min (ref >90)
Glucose, Bld: 84 mg/dL (ref 70–99)

## 2013-09-28 LAB — URINALYSIS, ROUTINE W REFLEX MICROSCOPIC
Hgb urine dipstick: NEGATIVE
Leukocytes, UA: NEGATIVE
Protein, ur: NEGATIVE mg/dL
Urobilinogen, UA: 0.2 mg/dL (ref 0.0–1.0)

## 2013-09-28 LAB — CBC
Hemoglobin: 12.6 g/dL (ref 12.0–15.0)
MCH: 31 pg (ref 26.0–34.0)
MCV: 92.4 fL (ref 78.0–100.0)
RBC: 4.07 MIL/uL (ref 3.87–5.11)

## 2013-09-29 NOTE — Pre-Procedure Instructions (Signed)
Dr Jayme Cloud made aware that patient on Xarelto and not told to stop. Instructed to call and have patient not take any more until after surgery. Called patient and she verbalizes understanding of this.

## 2013-09-29 NOTE — Pre-Procedure Instructions (Signed)
Dr Gonzalez aware of K+ of 3.2. No orders given. 

## 2013-10-01 ENCOUNTER — Ambulatory Visit (HOSPITAL_COMMUNITY)
Admission: RE | Admit: 2013-10-01 | Discharge: 2013-10-01 | Disposition: A | Discharge status: Home / Self Care | Payer: 59 | Source: Ambulatory Visit | Attending: Obstetrics & Gynecology | Admitting: Obstetrics & Gynecology

## 2013-10-01 ENCOUNTER — Ambulatory Visit (HOSPITAL_COMMUNITY): Payer: 59 | Admitting: Anesthesiology

## 2013-10-01 ENCOUNTER — Encounter (HOSPITAL_COMMUNITY): Payer: Self-pay | Admitting: Anesthesiology

## 2013-10-01 ENCOUNTER — Encounter (HOSPITAL_COMMUNITY)
Admission: RE | Disposition: A | Discharge status: Home / Self Care | Payer: Self-pay | Source: Ambulatory Visit | Attending: Obstetrics & Gynecology

## 2013-10-01 DIAGNOSIS — N920 Excessive and frequent menstruation with regular cycle: Secondary | ICD-10-CM

## 2013-10-01 DIAGNOSIS — Z302 Encounter for sterilization: Secondary | ICD-10-CM

## 2013-10-01 DIAGNOSIS — Z9889 Other specified postprocedural states: Secondary | ICD-10-CM

## 2013-10-01 DIAGNOSIS — I2699 Other pulmonary embolism without acute cor pulmonale: Secondary | ICD-10-CM

## 2013-10-01 DIAGNOSIS — N946 Dysmenorrhea, unspecified: Secondary | ICD-10-CM | POA: Insufficient documentation

## 2013-10-01 DIAGNOSIS — Z01812 Encounter for preprocedural laboratory examination: Secondary | ICD-10-CM | POA: Insufficient documentation

## 2013-10-01 DIAGNOSIS — Z7901 Long term (current) use of anticoagulants: Secondary | ICD-10-CM | POA: Insufficient documentation

## 2013-10-01 HISTORY — PX: LAPAROSCOPIC TUBAL LIGATION: SHX1937

## 2013-10-01 HISTORY — PX: DILITATION & CURRETTAGE/HYSTROSCOPY WITH THERMACHOICE ABLATION: SHX5569

## 2013-10-01 SURGERY — LIGATION, FALLOPIAN TUBE, LAPAROSCOPIC
Anesthesia: General | Site: Abdomen | Wound class: Clean

## 2013-10-01 MED ORDER — KETOROLAC TROMETHAMINE 30 MG/ML IJ SOLN
30.0000 mg | Freq: Once | INTRAMUSCULAR | Status: AC
  Administered 2013-10-01: 30 mg via INTRAVENOUS

## 2013-10-01 MED ORDER — ONDANSETRON HCL 8 MG PO TABS: 8.0000 mg | ORAL_TABLET | Freq: Three times a day (TID) | ORAL | Status: DC | PRN

## 2013-10-01 MED ORDER — CEFAZOLIN SODIUM-DEXTROSE 2-3 GM-% IV SOLR
2.0000 g | INTRAVENOUS | Status: AC
  Administered 2013-10-01: 2 g via INTRAVENOUS

## 2013-10-01 MED ORDER — KETOROLAC TROMETHAMINE 30 MG/ML IJ SOLN
INTRAMUSCULAR | Status: AC
  Filled 2013-10-01: qty 1

## 2013-10-01 MED ORDER — ONDANSETRON HCL 4 MG/2ML IJ SOLN
INTRAMUSCULAR | Status: AC
  Filled 2013-10-01: qty 2

## 2013-10-01 MED ORDER — DEXAMETHASONE SODIUM PHOSPHATE 4 MG/ML IJ SOLN
4.0000 mg | Freq: Once | INTRAMUSCULAR | Status: AC
  Administered 2013-10-01: 4 mg via INTRAVENOUS

## 2013-10-01 MED ORDER — MIDAZOLAM HCL 2 MG/2ML IJ SOLN
INTRAMUSCULAR | Status: AC
  Filled 2013-10-01: qty 2

## 2013-10-01 MED ORDER — FENTANYL CITRATE 0.05 MG/ML IJ SOLN
INTRAMUSCULAR | Status: AC
  Filled 2013-10-01: qty 2

## 2013-10-01 MED ORDER — DEXAMETHASONE SODIUM PHOSPHATE 4 MG/ML IJ SOLN
INTRAMUSCULAR | Status: AC
  Filled 2013-10-01: qty 1

## 2013-10-01 MED ORDER — NEOSTIGMINE METHYLSULFATE 1 MG/ML IJ SOLN
INTRAMUSCULAR | Status: DC | PRN
  Administered 2013-10-01: 3 mg via INTRAVENOUS

## 2013-10-01 MED ORDER — SODIUM CHLORIDE 0.9 % IR SOLN
Status: DC | PRN
  Administered 2013-10-01: 3000 mL

## 2013-10-01 MED ORDER — LIDOCAINE HCL (PF) 1 % IJ SOLN
INTRAMUSCULAR | Status: AC
  Filled 2013-10-01: qty 5

## 2013-10-01 MED ORDER — LACTATED RINGERS IV SOLN
INTRAVENOUS | Status: DC
  Administered 2013-10-01: 1000 mL via INTRAVENOUS

## 2013-10-01 MED ORDER — FENTANYL CITRATE 0.05 MG/ML IJ SOLN
25.0000 ug | INTRAMUSCULAR | Status: DC | PRN
  Administered 2013-10-01: 25 ug via INTRAVENOUS
  Administered 2013-10-01 (×4): 50 ug via INTRAVENOUS
  Administered 2013-10-01: 25 ug via INTRAVENOUS
  Filled 2013-10-01: qty 2

## 2013-10-01 MED ORDER — PROPOFOL 10 MG/ML IV EMUL
INTRAVENOUS | Status: AC
  Filled 2013-10-01: qty 20

## 2013-10-01 MED ORDER — ROCURONIUM BROMIDE 50 MG/5ML IV SOLN
INTRAVENOUS | Status: AC
  Filled 2013-10-01: qty 1

## 2013-10-01 MED ORDER — HYDROCODONE-ACETAMINOPHEN 5-325 MG PO TABS: 1.0000 | ORAL_TABLET | Freq: Four times a day (QID) | ORAL | Status: DC | PRN

## 2013-10-01 MED ORDER — FENTANYL CITRATE 0.05 MG/ML IJ SOLN
25.0000 ug | INTRAMUSCULAR | Status: AC
  Administered 2013-10-01 (×2): 25 ug via INTRAVENOUS

## 2013-10-01 MED ORDER — MIDAZOLAM HCL 2 MG/2ML IJ SOLN
1.0000 mg | INTRAMUSCULAR | Status: DC | PRN
  Administered 2013-10-01: 2 mg via INTRAVENOUS

## 2013-10-01 MED ORDER — ONDANSETRON HCL 4 MG/2ML IJ SOLN
4.0000 mg | Freq: Once | INTRAMUSCULAR | Status: AC
  Administered 2013-10-01: 4 mg via INTRAVENOUS

## 2013-10-01 MED ORDER — MIDAZOLAM HCL 5 MG/5ML IJ SOLN
INTRAMUSCULAR | Status: DC | PRN
  Administered 2013-10-01: 30 mg via INTRAVENOUS

## 2013-10-01 MED ORDER — ROCURONIUM BROMIDE 100 MG/10ML IV SOLN
INTRAVENOUS | Status: DC | PRN
  Administered 2013-10-01: 30 mg via INTRAVENOUS

## 2013-10-01 MED ORDER — KETOROLAC TROMETHAMINE 10 MG PO TABS: 10.0000 mg | ORAL_TABLET | Freq: Three times a day (TID) | ORAL | Status: DC | PRN

## 2013-10-01 MED ORDER — GLYCOPYRROLATE 0.2 MG/ML IJ SOLN
INTRAMUSCULAR | Status: DC | PRN
  Administered 2013-10-01: .6 mg via INTRAVENOUS

## 2013-10-01 MED ORDER — FENTANYL CITRATE 0.05 MG/ML IJ SOLN
INTRAMUSCULAR | Status: AC
  Filled 2013-10-01: qty 5

## 2013-10-01 MED ORDER — SODIUM CHLORIDE 0.9 % IR SOLN
Status: DC | PRN
  Administered 2013-10-01: 1000 mL

## 2013-10-01 MED ORDER — GLYCOPYRROLATE 0.2 MG/ML IJ SOLN
INTRAMUSCULAR | Status: AC
  Filled 2013-10-01: qty 3

## 2013-10-01 MED ORDER — DEXTROSE 5 % IV SOLN
INTRAVENOUS | Status: DC | PRN
  Administered 2013-10-01: 15 mL

## 2013-10-01 MED ORDER — FENTANYL CITRATE 0.05 MG/ML IJ SOLN
INTRAMUSCULAR | Status: DC | PRN
  Administered 2013-10-01 (×5): 50 ug via INTRAVENOUS

## 2013-10-01 MED ORDER — CEFAZOLIN SODIUM-DEXTROSE 2-3 GM-% IV SOLR
INTRAVENOUS | Status: AC
  Filled 2013-10-01: qty 50

## 2013-10-01 MED ORDER — ONDANSETRON HCL 4 MG/2ML IJ SOLN: 4.0000 mg | Freq: Once | INTRAMUSCULAR | Status: DC | PRN

## 2013-10-01 MED ORDER — PROPOFOL 10 MG/ML IV BOLUS
INTRAVENOUS | Status: DC | PRN
  Administered 2013-10-01: 150 mg via INTRAVENOUS

## 2013-10-01 SURGICAL SUPPLY — 49 items
BAG DECANTER FOR FLEXI CONT (MISCELLANEOUS) ×3 IMPLANT
BAG HAMPER (MISCELLANEOUS) ×3 IMPLANT
BLADE SURG SZ11 CARB STEEL (BLADE) ×3 IMPLANT
CATH ROBINSON RED A/P 16FR (CATHETERS) ×1 IMPLANT
CATH THERMACHOICE III (CATHETERS) ×3 IMPLANT
CLOTH BEACON ORANGE TIMEOUT ST (SAFETY) ×3 IMPLANT
COVER LIGHT HANDLE STERIS (MISCELLANEOUS) ×6 IMPLANT
COVER MAYO STAND XLG (DRAPE) ×3 IMPLANT
ELECT REM PT RETURN 9FT ADLT (ELECTROSURGICAL) ×3
ELECTRODE REM PT RTRN 9FT ADLT (ELECTROSURGICAL) ×2 IMPLANT
FORMALIN 10 PREFIL 120ML (MISCELLANEOUS) ×2 IMPLANT
GAUZE SPONGE 4X4 16PLY XRAY LF (GAUZE/BANDAGES/DRESSINGS) ×3 IMPLANT
GLOVE BIOGEL PI IND STRL 7.0 (GLOVE) IMPLANT
GLOVE BIOGEL PI IND STRL 8 (GLOVE) ×2 IMPLANT
GLOVE BIOGEL PI INDICATOR 7.0 (GLOVE) ×1
GLOVE BIOGEL PI INDICATOR 8 (GLOVE) ×2
GLOVE ECLIPSE 6.5 STRL STRAW (GLOVE) ×1 IMPLANT
GLOVE ECLIPSE 8.0 STRL XLNG CF (GLOVE) ×3 IMPLANT
GLOVE EXAM NITRILE LRG STRL (GLOVE) ×1 IMPLANT
GLOVE EXAM NITRILE MD LF STRL (GLOVE) ×1 IMPLANT
GOWN STRL REIN XL XLG (GOWN DISPOSABLE) ×6 IMPLANT
INST SET HYSTEROSCOPY (KITS) ×3 IMPLANT
INST SET LAPROSCOPIC GYN AP (KITS) ×3 IMPLANT
IV D5W 500ML (IV SOLUTION) ×3 IMPLANT
IV NS IRRIG 3000ML ARTHROMATIC (IV SOLUTION) ×3 IMPLANT
KIT ROOM TURNOVER AP CYSTO (KITS) ×3 IMPLANT
MANIFOLD NEPTUNE II (INSTRUMENTS) ×3 IMPLANT
MARKER SKIN DUAL TIP RULER LAB (MISCELLANEOUS) ×3 IMPLANT
NDL INSUFFLATION 14GA 120MM (NEEDLE) ×2 IMPLANT
NEEDLE INSUFFLATION 14GA 120MM (NEEDLE) ×3 IMPLANT
NS IRRIG 1000ML POUR BTL (IV SOLUTION) ×3 IMPLANT
PACK BASIC III (CUSTOM PROCEDURE TRAY) ×3
PACK PERI GYN (CUSTOM PROCEDURE TRAY) ×3 IMPLANT
PACK SRG BSC III STRL LF ECLPS (CUSTOM PROCEDURE TRAY) ×2 IMPLANT
PAD ARMBOARD 7.5X6 YLW CONV (MISCELLANEOUS) ×3 IMPLANT
PAD TELFA 3X4 1S STER (GAUZE/BANDAGES/DRESSINGS) ×3 IMPLANT
SET BASIN LINEN APH (SET/KITS/TRAYS/PACK) ×3 IMPLANT
SET IRRIG Y TYPE TUR BLADDER L (SET/KITS/TRAYS/PACK) ×3 IMPLANT
SHEET LAVH (DRAPES) ×3 IMPLANT
SOLUTION ANTI FOG 6CC (MISCELLANEOUS) ×3 IMPLANT
SPONGE GAUZE 2X2 8PLY STRL LF (GAUZE/BANDAGES/DRESSINGS) ×3 IMPLANT
STAPLER VISISTAT 35W (STAPLE) ×3 IMPLANT
SUT VICRYL 0 UR6 27IN ABS (SUTURE) ×3 IMPLANT
SYRINGE 10CC LL (SYRINGE) ×1 IMPLANT
TAPE CLOTH SURG 4X10 WHT LF (GAUZE/BANDAGES/DRESSINGS) ×1 IMPLANT
TROCAR Z-THRD FIOS HNDL 11X100 (TROCAR) ×3 IMPLANT
TUBING INSUFFLATION HIGH FLOW (TUBING) ×3 IMPLANT
WARMER LAPAROSCOPE (MISCELLANEOUS) ×3 IMPLANT
YANKAUER SUCT BULB TIP 10FT TU (MISCELLANEOUS) ×3 IMPLANT

## 2013-10-01 NOTE — Anesthesia Postprocedure Evaluation (Signed)
  Anesthesia Post-op Note  Patient: [name]  Procedure(s) Performed: Procedure(s) with comments: DILATATION & CURETTAGE/HYSTEROSCOPY WITH THERMACHOICE ABLATION (N/A) - Total Ablation Therapy Time = 8 minutes 59 seconds LAPAROSCOPIC TUBAL LIGATION (Bilateral)  Patient Location: PACU  Anesthesia Type:General  Level of Consciousness: awake, alert  and oriented  Airway and Oxygen Therapy: Patient Spontanous Breathing and Patient connected to face mask oxygen  Post-op Pain: moderate  Post-op Assessment: Post-op Vital signs reviewed, Patient's Cardiovascular Status Stable, Respiratory Function Stable, Patent Airway and No signs of Nausea or vomiting  Post-op Vital Signs: Reviewed and stable  Complications: No apparent anesthesia complications

## 2013-10-01 NOTE — H&P (Signed)
Preoperative History and Physical  [name] is a 37 y.o. [id number] with Patient's last menstrual period was 09/03/2013. admitted for a hysteroscopy uterine curettage and endometrial ablation with a laparoscopic bilateral tubal ligation. Periods are very heavy and quite painful, wants semi conservative definitive treatment.  She is status post a pulmonary embolus on xarelto PMH:  Past Medical History   Diagnosis  Date   .  Chronic low back pain    .  Pulmonary embolism    .  Menorrhagia  09/13/2013   PSH:  Past Surgical History   Procedure  Laterality  Date   .  Appendectomy     POb/GynH:  OB History    Grav  Para  Term  Preterm  Abortions  TAB  SAB  Ect  Mult  Living    3  2    1   1    2      SH:  History   Substance Use Topics   .  Smoking status:  Former Games developer   .  Smokeless tobacco:  Never Used   .  Alcohol Use:  Yes      Comment: socially   FH:  Family History   Problem  Relation  Age of Onset   .  Breast cancer  Mother    .  COPD  Mother    .  Heart Problems  Father    .  Cancer  Maternal Grandmother      breast   .  Breast cancer  Maternal Aunt    .  Cancer  Maternal Aunt      ovarian   Allergies: No Known Allergies  Medications: Current outpatient prescriptions:cyclobenzaprine (FLEXERIL) 10 MG tablet, Take 10 mg by mouth 3 (three) times daily. Take for 10 days, Disp: , Rfl: ; oxyCODONE-acetaminophen (PERCOCET/ROXICET) 5-325 MG per tablet, Take 1-2 tablets by mouth every 6 (six) hours as needed for pain., Disp: 20 tablet, Rfl: 0  Rivaroxaban (XARELTO) 20 MG TABS, Take 1 tablet (20 mg total) by mouth daily with supper. Begin when 15 mg twice daily treatment (21 days total) is complete., Disp: 30 tablet, Rfl: 0; traMADol-acetaminophen (ULTRACET) 37.5-325 MG per tablet, , Disp: , Rfl:  Review of Systems:  Review of Systems  Constitutional: Negative for fever, chills, weight loss, malaise/fatigue and diaphoresis.  HENT: Negative for hearing loss, ear pain, nosebleeds,  congestion, sore throat, neck pain, tinnitus and ear discharge.  Eyes: Negative for blurred vision, double vision, photophobia, pain, discharge and redness.  Respiratory: Negative for cough, hemoptysis, sputum production, shortness of breath, wheezing and stridor.  Cardiovascular: Negative for chest pain, palpitations, orthopnea, claudication, leg swelling and PND.  Gastrointestinal: Positive for abdominal pain. Negative for heartburn, nausea, vomiting, diarrhea, constipation, blood in stool and melena.  Genitourinary: Negative for dysuria, urgency, frequency, hematuria and flank pain.  Musculoskeletal: Negative for myalgias, back pain, joint pain and falls.  Skin: Negative for itching and rash.  Neurological: Negative for dizziness, tingling, tremors, sensory change, speech change, focal weakness, seizures, loss of consciousness, weakness and headaches.  Endo/Heme/Allergies: Negative for environmental allergies and polydipsia. Does not bruise/bleed easily.  Psychiatric/Behavioral: Negative for depression, suicidal ideas, hallucinations, memory loss and substance abuse. The patient is not nervous/anxious and does not have insomnia.  PHYSICAL EXAM:  Blood pressure 100/60, height 5\' 8"  (1.727 m), weight 172 lb (78.019 kg), last menstrual period 09/03/2013.  Vitals reviewed.  Constitutional: She is oriented to person, place, and time. She appears well-developed and well-nourished.  HENT:  Head: Normocephalic and atraumatic.  Right Ear: External ear normal.  Left Ear: External ear normal.  Nose: Nose normal.  Mouth/Throat: Oropharynx is clear and moist.  Eyes: Conjunctivae and EOM are normal. Pupils are equal, round, and reactive to light. Right eye exhibits no discharge. Left eye exhibits no discharge. No scleral icterus.  Neck: Normal range of motion. Neck supple. No tracheal deviation present. No thyromegaly present.  Cardiovascular: Normal rate, regular rhythm, normal heart sounds and intact  distal pulses. Exam reveals no gallop and no friction rub.  No murmur heard.  Respiratory: Effort normal and breath sounds normal. No respiratory distress. She has no wheezes. She has no rales. She exhibits no tenderness.  GI: Soft. Bowel sounds are normal. She exhibits no distension and no mass. There is tenderness. There is no rebound and no guarding.  Genitourinary:  Vulva is normal without lesions Vagina is pink moist without discharge Cervix normal in appearance and pap is normal Uterus is normal size shape and contour Adnexa is negative with normal sized ovaries by sonogram  Musculoskeletal: Normal range of motion. She exhibits no edema and no tenderness.  Neurological: She is alert and oriented to person, place, and time. She has normal reflexes. She displays normal reflexes. No cranial nerve deficit. She exhibits normal muscle tone. Coordination normal.  Skin: Skin is warm and dry. No rash noted. No erythema. No pallor.  Psychiatric: She has a normal mood and affect. Her behavior is normal. Judgment and thought content normal.  Labs:  No results found for this or any previous visit (from the past 336 hour(s)).  EKG:  Orders placed during the hospital encounter of 07/12/13   .  EKG 12-LEAD   .  EKG 12-LEAD   Imaging Studies:  No results found.  Assessment:  Patient Active Problem List    Diagnosis  Date Noted   .  Menorrhagia  09/13/2013   .  Mid back pain  07/13/2013   .  Pulmonary embolism  07/13/2013   .  Pleuritic pain  07/13/2013   .  Tobacco abuse  07/13/2013   Plan:  As above laparoscopic BTL with hysteroscopy uterine curettage and endometrial abalation   Avianah Pellman H \\10 /02/2013 7:28 AM

## 2013-10-01 NOTE — Transfer of Care (Signed)
Immediate Anesthesia Transfer of Care Note  Patient: [name]  Procedure(s) Performed: Procedure(s) with comments: DILATATION & CURETTAGE/HYSTEROSCOPY WITH THERMACHOICE ABLATION (N/A) - Total Ablation Therapy Time = 8 minutes 59 seconds LAPAROSCOPIC TUBAL LIGATION (Bilateral)  Patient Location: PACU  Anesthesia Type:General  Level of Consciousness: awake  Airway & Oxygen Therapy: Patient Spontanous Breathing  Post-op Assessment: Report given to PACU RN  Post vital signs: Reviewed  Complications: No apparent anesthesia complications

## 2013-10-01 NOTE — Op Note (Signed)
Preoperative Diagnosis:  1.  Multiparous female desires permanent sterilization                                          2.  Menometrorrhagia                                          3.  Dysmenorrhea                                          4.  Pulmonary embolus on anti coagulant  Postoperative Diagnosis:  Same as above  Procedure:  Laparoscopic Bilateral Tubal Ligation                      Diagnostic hysteroscopy and endometrial ablation  Surgeon:  Rockne Coons MD  Anaesthesia:  LMA  Findings:  Patient had normal pelvic anatomy and no intraperitoneal abnormalities.  Description of Operation:  Patient was taken to the OR and placed into supine position where she underwent LMA anaesthesia.  She was placed in the dorsal lithotomy position and prepped and draped in the usual sterile fashion.  An incision was made in the umbilicus and dissection taken down to the rectus fascia which was incised and opened.  The non bladed trocar was then placed and the peritoneal cavity was insufflated.  The above noted findings were observed.  The Kleppinger electrocautery unit was employed and both tubes were burned to no resistance and beyond in the distal ishtmic and ampullary regions, approximately a 3.5 cm segment bilaterally.  There was good hemostasis.  The fascia, peritoneum and subcutaneous tissue were closed using 0 vicryl.  The skin was closed using staples.   Findings: The endometrium was normal. There were no fibroid or other abnormalities.  Description of operation: The patient was taken to the operating room and placed in the supine position. She underwent general anesthesia using the laryngeal mask airway. She was placed in the dorsal lithotomy position and prepped and draped in the usual sterile fashion. A Graves speculum was placed and the anterior cervical lip was grasped with a single-tooth tenaculum. The cervix was dilated serially to allow passage of the hysteroscope. Diagnostic  hysteroscopy was performed and was found to be normal. A vigorous uterine curettage was then performed and all tissue sent to pathology for evaluation. The ThermaChoice 3 endometrial ablation balloon was then used were 15 cc of D5W was required to maintain a pressure of 190-200 mm of mercury throughout the procedure. Toatl therapy time was 8:55.  All of the equipment worked well throughout the procedure. All of the fluid was returned at the end of the procedure. The patient was awakened from anesthesia and taken to the recovery room in good stable condition all counts were correct. She received 2 g of Ancef and 30 mg of Toradol preoperatively. She will be discharged from the recovery room and followed up in the office in 1- 2 weeks.  EURE,LUTHER H 10/01/2013 8:49 AM  The patient was awakened from anaesthesia and taken to the PACU with all counts being correct x 3.  The patient received Ancef 1 gram and Toradol 30 mg IV preoperatively.

## 2013-10-01 NOTE — Anesthesia Procedure Notes (Signed)
Procedure Name: Intubation Date/Time: 10/01/2013 7:41 AM Performed by: Glynn Octave E Pre-anesthesia Checklist: Patient identified, Patient being monitored, Timeout performed, Emergency Drugs available and Suction available Patient Re-evaluated:Patient Re-evaluated prior to inductionOxygen Delivery Method: Circle System Utilized Preoxygenation: Pre-oxygenation with 100% oxygen Intubation Type: IV induction Ventilation: Mask ventilation without difficulty Laryngoscope Size: Mac and 3 Grade View: Grade I Tube type: Oral Tube size: 7.0 mm Number of attempts: 1 Airway Equipment and Method: stylet Placement Confirmation: ETT inserted through vocal cords under direct vision,  positive ETCO2 and breath sounds checked- equal and bilateral Secured at: 22 cm Tube secured with: Tape Dental Injury: Teeth and Oropharynx as per pre-operative assessment

## 2013-10-01 NOTE — Progress Notes (Signed)
Refuses po fluids. 

## 2013-10-01 NOTE — Anesthesia Preprocedure Evaluation (Addendum)
Anesthesia Evaluation  Patient identified by MRN, date of birth, ID band Patient awake    Reviewed: Allergy & Precautions, H&P , NPO status , Patient's Chart, lab work & pertinent test results  Airway Mallampati: II TM Distance: >3 FB     Dental  (+) Teeth Intact   Pulmonary Sleep apnea: PE 06-2013, xaralto started. , former smoker, PE breath sounds clear to auscultation        Cardiovascular negative cardio ROS  Rhythm:Regular Rate:Normal     Neuro/Psych    GI/Hepatic   Endo/Other    Renal/GU      Musculoskeletal   Abdominal   Peds  Hematology   Anesthesia Other Findings Off xaralto 48 hrs.  Reproductive/Obstetrics                          Anesthesia Physical Anesthesia Plan  ASA: II  Anesthesia Plan: General   Post-op Pain Management:    Induction: Intravenous  Airway Management Planned: Oral ETT  Additional Equipment:   Intra-op Plan:   Post-operative Plan: Extubation in OR  Informed Consent: I have reviewed the patients History and Physical, chart, labs and discussed the procedure including the risks, benefits and alternatives for the proposed anesthesia with the patient or authorized representative who has indicated his/her understanding and acceptance.     Plan Discussed with:   Anesthesia Plan Comments:        Anesthesia Quick Evaluation

## 2013-10-04 ENCOUNTER — Encounter (HOSPITAL_COMMUNITY): Payer: Self-pay | Admitting: Obstetrics & Gynecology

## 2013-10-04 ENCOUNTER — Telehealth: Payer: Self-pay | Admitting: Obstetrics & Gynecology

## 2013-10-04 NOTE — Telephone Encounter (Signed)
Pt had surgery (endometrial ablation and tubal ligation) on 10/01/2013 wants to know when she can start blood thinner, Xarelto 20 mg tablet daily, for pulmonary embolism.

## 2013-10-04 NOTE — Telephone Encounter (Signed)
I called pt and left her a message to continue to tke her xarelto, it was not my intention for her to stop it

## 2013-10-06 ENCOUNTER — Encounter: Payer: 59 | Admitting: Obstetrics & Gynecology

## 2013-10-08 ENCOUNTER — Encounter: Payer: Self-pay | Admitting: Obstetrics & Gynecology

## 2013-10-08 ENCOUNTER — Ambulatory Visit (INDEPENDENT_AMBULATORY_CARE_PROVIDER_SITE_OTHER): Payer: 59 | Admitting: Obstetrics & Gynecology

## 2013-10-08 VITALS — BP 100/60 | Ht 70.0 in | Wt 174.0 lb

## 2013-10-08 DIAGNOSIS — Z9889 Other specified postprocedural states: Secondary | ICD-10-CM

## 2013-10-08 NOTE — Progress Notes (Signed)
Patient ID: [name], female   DOB: May 20, 1976, 37 y.o.   MRN: [id number] [name] is in today for her postop visit She had a a laparoscopic bilateral tubal ligation and a hysteroscopic endometrial ablation perform on on 10/01/2013 Her postoperative course has been relatively unremarkable She reports having postoperative shoulder and neck pain which is secondary to the gas used during the laparoscopic tubal ligation She is having blood tinge vaginal discharge deal which of course is from her endometrial ablation  There was some slight confusion over restarting her xarelto and she restarted that on Monday  Her incision is clean dry and intact Her abdominal exam is benign Staples were removed There isn't even any significant bruising  On vaginal exam there is a clear watery pink tinged discharge emanating from the cervix Uterus is appropriately tender post ablation and bimanual exam  We will see the patient back as needed her for yearly exams and she is instructed that her first the periods may be not indicative of for her long-time result is: The If she has any difficulty she will give me a call in the meantime

## 2014-06-02 ENCOUNTER — Other Ambulatory Visit (HOSPITAL_COMMUNITY): Payer: Self-pay | Admitting: Physician Assistant

## 2014-06-02 ENCOUNTER — Ambulatory Visit (HOSPITAL_COMMUNITY)
Admission: RE | Admit: 2014-06-02 | Discharge: 2014-06-02 | Disposition: A | Discharge status: Home / Self Care | Payer: 59 | Source: Ambulatory Visit | Attending: Physician Assistant | Admitting: Physician Assistant

## 2014-06-02 DIAGNOSIS — R1013 Epigastric pain: Secondary | ICD-10-CM

## 2014-09-19 ENCOUNTER — Encounter: Payer: Self-pay | Admitting: Adult Health

## 2014-09-19 ENCOUNTER — Ambulatory Visit (INDEPENDENT_AMBULATORY_CARE_PROVIDER_SITE_OTHER): Payer: 59 | Admitting: Adult Health

## 2014-09-19 VITALS — BP 118/72 | HR 74 | Ht 67.75 in | Wt 191.5 lb

## 2014-09-19 DIAGNOSIS — Z01419 Encounter for gynecological examination (general) (routine) without abnormal findings: Secondary | ICD-10-CM

## 2014-09-19 NOTE — Patient Instructions (Signed)
Physical and pap in 1 year Mammogram now  Will with labs

## 2014-09-19 NOTE — Progress Notes (Signed)
Patient ID: [name], female   DOB: 1976-05-21, 38 y.o.   MRN: [id number] History of Present Illness: [name] is a 38 year old white female, married, in for a physical.Had normal pap with negative HPV,09/08/12.Son plays traveling ball, and they are on the road every weekend.   Current Medications, Allergies, Past Medical History, Past Surgical History, Family History and Social History were reviewed in [employer] record.     Review of Systems: Patient denies any headaches, blurred vision, shortness of breath, chest pain, abdominal pain, problems with bowel movements, urination, or intercourse. No joint pain or mood swings.Had ablation and spots sometimes and will feel light headed when does.    Physical Exam:BP 118/72  Pulse 74  Ht 5' 7.75" (1.721 m)  Wt 191 lb 8 oz (86.864 kg)  BMI 29.33 kg/m2  LMP 09/10/2014 General:  Well developed, well nourished, no acute distress Skin:  Warm and dry Neck:  Midline trachea, normal thyroid Lungs; Clear to auscultation bilaterally Breast:  No dominant palpable mass, retraction, or nipple discharge Cardiovascular: Regular rate and rhythm Abdomen:  Soft, non tender, no hepatosplenomegaly Pelvic:  External genitalia is normal in appearance.  The vagina is normal in appearance.  The cervix is bulbous.  Uterus is felt to be normal size, shape, and contour.  No   adnexal masses or tenderness noted. Extremities:  No swelling or varicosities noted Psych:  No mood changes, alert and cooperative,seems happy   Impression: Yearly gyn exam no pap    Plan: Physical and pap in 1 year Mammogram now due to family history Check CBC,CMP,TSH and lipids

## 2014-09-20 ENCOUNTER — Telehealth: Payer: Self-pay | Admitting: Adult Health

## 2014-09-20 LAB — COMPREHENSIVE METABOLIC PANEL
ALT: 11 U/L (ref 0–35)
AST: 15 U/L (ref 0–37)
Albumin: 4.5 g/dL (ref 3.5–5.2)
Alkaline Phosphatase: 57 U/L (ref 39–117)
BILIRUBIN TOTAL: 0.3 mg/dL (ref 0.2–1.2)
BUN: 9 mg/dL (ref 6–23)
CHLORIDE: 102 meq/L (ref 96–112)
CO2: 27 mEq/L (ref 19–32)
CREATININE: 0.71 mg/dL (ref 0.50–1.10)
Calcium: 9.5 mg/dL (ref 8.4–10.5)
Glucose, Bld: 88 mg/dL (ref 70–99)
Potassium: 4.6 mEq/L (ref 3.5–5.3)
Sodium: 139 mEq/L (ref 135–145)
Total Protein: 7.1 g/dL (ref 6.0–8.3)

## 2014-09-20 LAB — LIPID PANEL
CHOL/HDL RATIO: 2.9 ratio
Cholesterol: 193 mg/dL (ref 0–200)
HDL: 67 mg/dL (ref >39)
LDL CALC: 110 mg/dL — AB (ref 0–99)
Triglycerides: 78 mg/dL (ref <150)
VLDL: 16 mg/dL (ref 0–40)

## 2014-09-20 LAB — CBC
HCT: 39.3 % (ref 36.0–46.0)
Hemoglobin: 13 g/dL (ref 12.0–15.0)
MCH: 30.3 pg (ref 26.0–34.0)
MCHC: 33.1 g/dL (ref 30.0–36.0)
MCV: 91.6 fL (ref 78.0–100.0)
Platelets: 228 10*3/uL (ref 150–400)
RBC: 4.29 MIL/uL (ref 3.87–5.11)
RDW: 12.7 % (ref 11.5–15.5)
WBC: 5.6 10*3/uL (ref 4.0–10.5)

## 2014-09-20 LAB — TSH: TSH: 2.098 u[IU]/mL (ref 0.350–4.500)

## 2014-09-20 NOTE — Telephone Encounter (Signed)
Pt aware labs look great 

## 2014-10-31 ENCOUNTER — Encounter: Payer: Self-pay | Admitting: Adult Health

## 2014-11-22 IMAGING — CR DG LUMBAR SPINE 2-3V
3 series · 3 of 3 positions shown · non-contrast
Comparison: CT of the abdomen pelvis and 2668

CLINICAL DATA: Low back pain into the right leg with numbness and
tingling off and on since [REDACTED].  No known injury

LUMBAR SPINE - 2-3 VIEW

[view not recorded (1 of 3)]
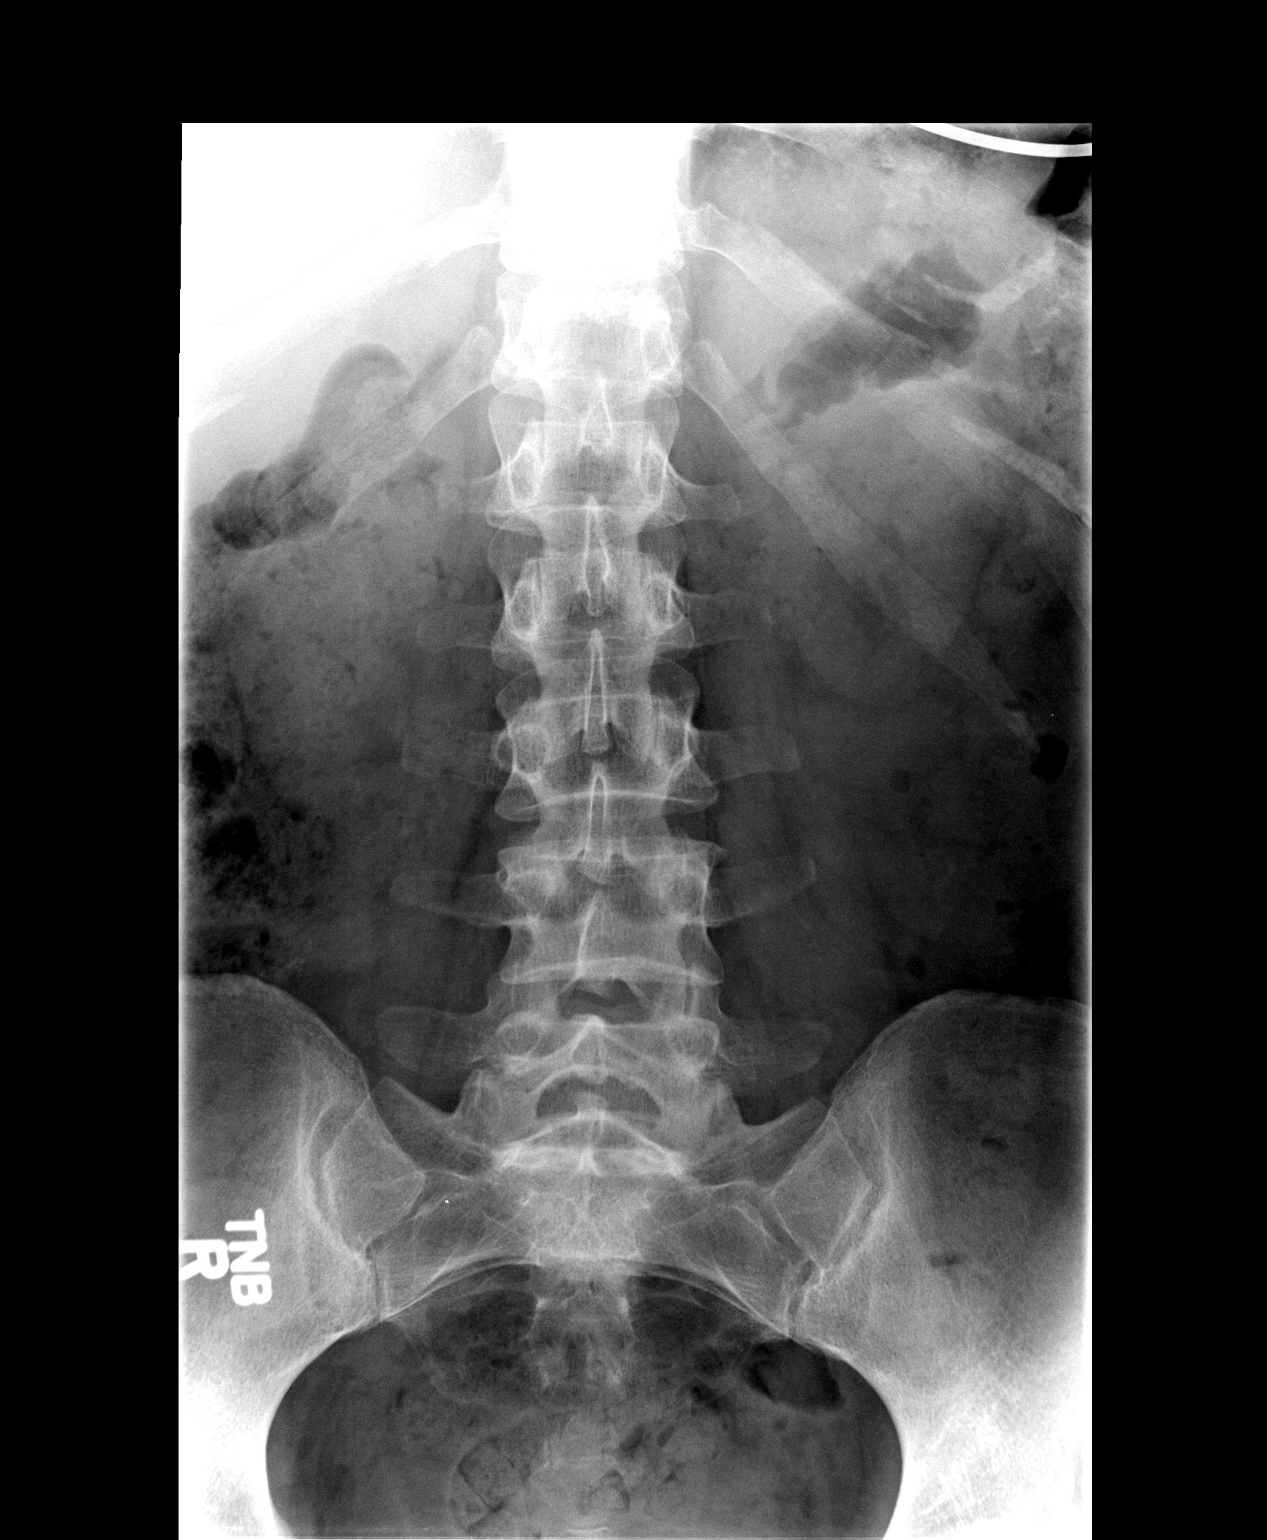

[view not recorded (2 of 3)]
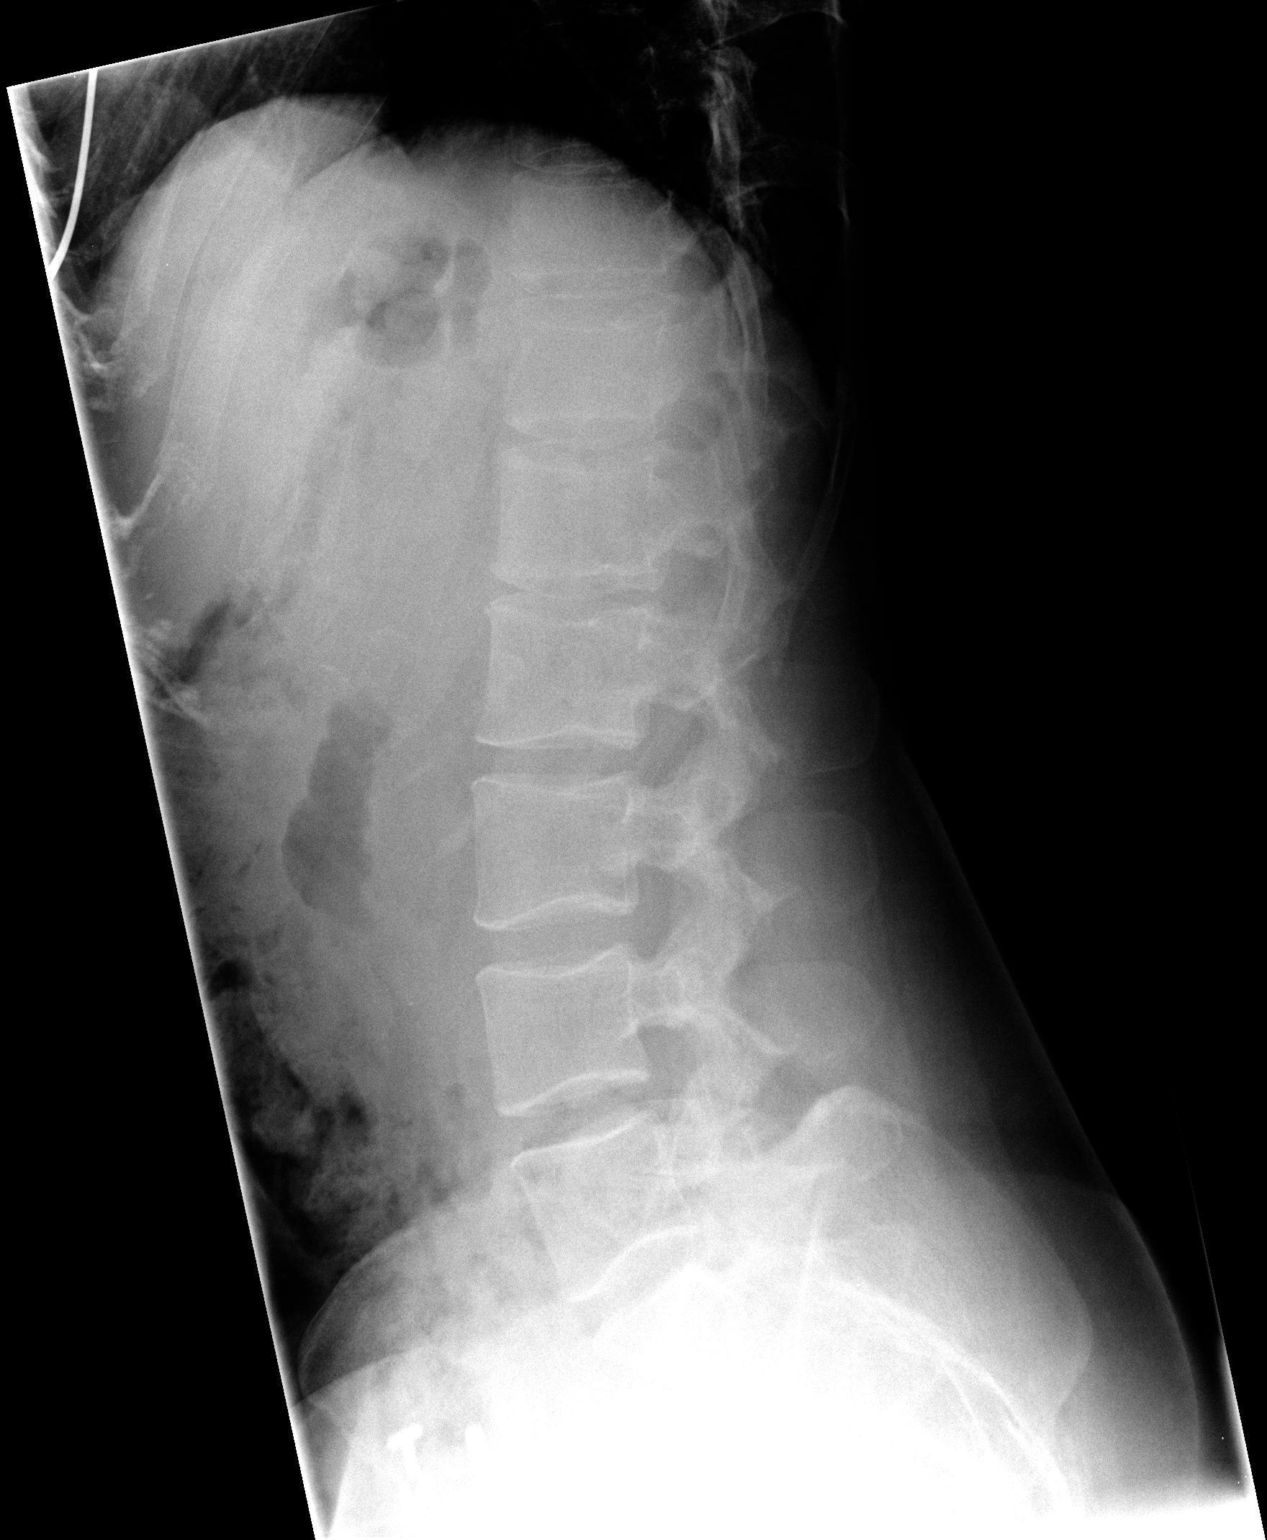

[view not recorded (3 of 3)]
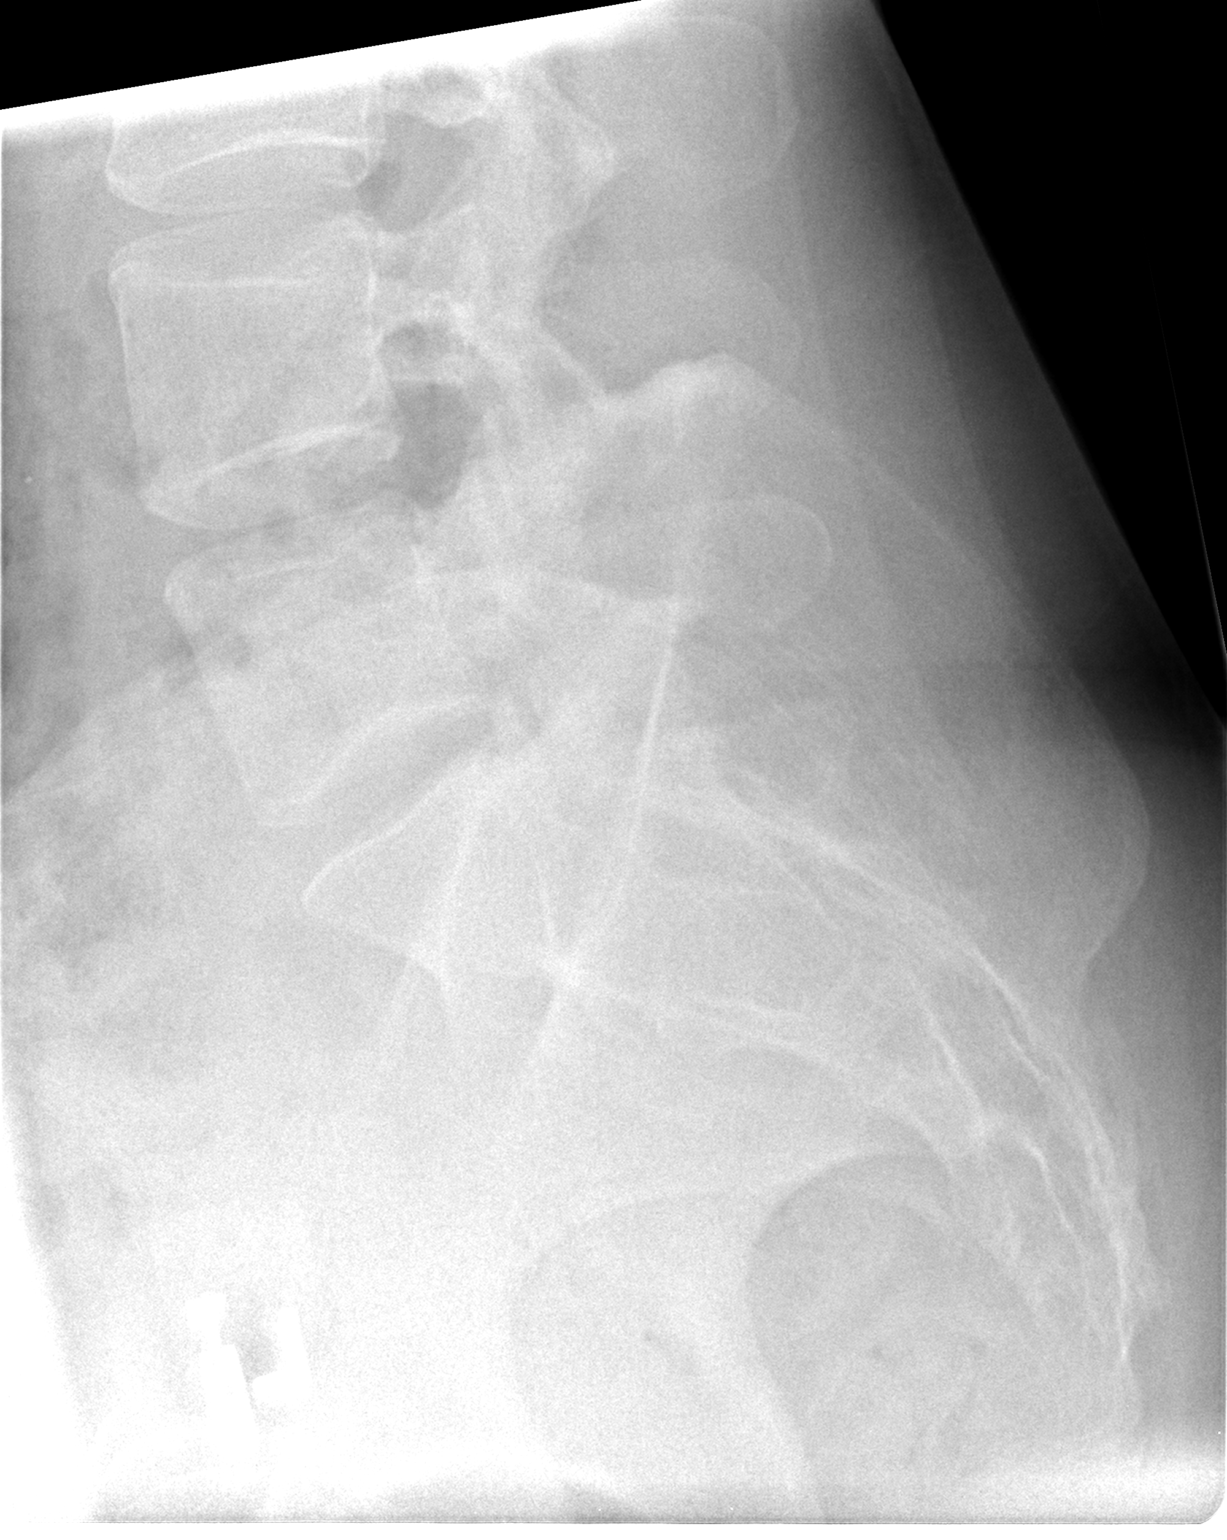

[3 of 3 positions shown; findings below may reference images not displayed]

FINDINGS: There are five lumbar type vertebral bodies.  Vertebral
body heights and disc spaces appear maintained.  Bony alignment
appears intact.  No significant degenerative change is identified.
No worrisome focal bony abnormality is noted.

The sacral white lines and sacroiliac joints appear maintained.
IMPRESSION: No focal abnormality identified

## 2015-09-20 ENCOUNTER — Other Ambulatory Visit (HOSPITAL_COMMUNITY)
Admission: RE | Admit: 2015-09-20 | Discharge: 2015-09-20 | Disposition: A | Discharge status: Home / Self Care | Payer: 59 | Source: Ambulatory Visit | Attending: Adult Health | Admitting: Adult Health

## 2015-09-20 ENCOUNTER — Encounter: Payer: Self-pay | Admitting: Adult Health

## 2015-09-20 ENCOUNTER — Other Ambulatory Visit: Payer: Self-pay

## 2015-09-20 ENCOUNTER — Ambulatory Visit (INDEPENDENT_AMBULATORY_CARE_PROVIDER_SITE_OTHER): Payer: 59 | Admitting: Adult Health

## 2015-09-20 VITALS — BP 110/70 | HR 84 | Ht 68.5 in | Wt 194.0 lb

## 2015-09-20 DIAGNOSIS — Z1151 Encounter for screening for human papillomavirus (HPV): Secondary | ICD-10-CM | POA: Diagnosis present

## 2015-09-20 DIAGNOSIS — N943 Premenstrual tension syndrome: Secondary | ICD-10-CM

## 2015-09-20 DIAGNOSIS — Z01419 Encounter for gynecological examination (general) (routine) without abnormal findings: Secondary | ICD-10-CM | POA: Insufficient documentation

## 2015-09-20 DIAGNOSIS — Z1231 Encounter for screening mammogram for malignant neoplasm of breast: Secondary | ICD-10-CM

## 2015-09-20 HISTORY — DX: Premenstrual tension syndrome: N94.3

## 2015-09-20 MED ORDER — SERTRALINE HCL 50 MG PO TABS: 50.0000 mg | ORAL_TABLET | Freq: Every day | ORAL | Status: DC

## 2015-09-20 NOTE — Patient Instructions (Addendum)
Start zoloft 1 daily Physical in 1 year Get mammogram 3D Premenstrual Syndrome Premenstrual syndrome (PMS) is a condition that consists of physical, emotional, and behavioral symptoms that affect women of childbearing age. PMS occurs 5-14 days before the start of a menstrual period and often recurs in a predictable pattern. The symptoms go away a few days after the menstrual period starts. PMS can interfere in many ways with normal daily activities and can range from mild to severe. When PMS is considered severe, it may be diagnosed as premenstrual dysphoric disorder (PMDD). A small percentage of women are affected by PMS symptoms and an even smaller percentage of those women are affected by PMDD.  CAUSES  The exact cause of PMS is unknown, but it seems to be related to cyclic hormone changes that happen before menstruation. These hormones are thought to affect chemicals in the brain (serotonin) that can influence a person's mood.  SYMPTOMS  Symptoms of PMS recur consistently from month to month and go away completely after the menstrual period starts. The most common emotional or behavioral symptom is mood swings. These mood swings can be disabling and interfere with normal activities of daily living. Other common symptoms include depression and angry outbursts. Other symptoms may include:   Irritability.  Anxiety.  Crying spells.   Food cravings or appetite changes.   Changes in sexual desire.   Confusion.   Aggression.   Social withdrawal.   Poor concentration. The most common physical symptoms include a sense of bloating, breast pain, headaches, and extreme fatigue. Other physical symptoms include:   Backaches.   Swelling of the hands and feet.   Weight gain.   Hot flashes.  DIAGNOSIS  To make a diagnosis, your caregiver will ask questions to confirm that you are having a pattern of symptoms. Symptoms must:   Be present 5 days before the start of your period and  be present at least 3 months in a row.   End within 4 days after your period starts.   Interfere with some of your normal activities.  Other conditions, such as thyroid disease, depression, and migraine headaches must be ruled out before a diagnosis of PMS is confirmed.  TREATMENT  Your caregiver may suggest ways to maintain a healthy lifestyle, such as exercise. Over-the-counter pain relievers may ease cramps, aches, pains, headaches, and breast tenderness. However, selective serotonin reuptake inhibitors (SSRIs) are medicines that are most beneficial in improving PMS if taken in the second half of the monthly cycle. They may be taken on a daily basis. The most effective oral contraceptive pill used for symptoms of PMS is one that contains the ingredient drospirenone. Taking 4 days off of the pill instead of the usual 7 days also has shown to increase effectiveness.  There are a number of drugs, dietary supplements, vitamins, and water pills (diuretics) which have been suggested to be helpful but have not shown to be of any benefit to improving PMS symptoms.  HOME CARE INSTRUCTIONS   For 2-3 months, write down your symptoms, their severity, and how long they last. This may help your caregiver prescribe the best treatment for your symptoms.  Exercise regularly as suggested by your caregiver.  Eat a regular, well-balanced diet.  Avoid caffeine, alcohol, and tobacco consumption.  Limit salt and salty foods to lessen bloating and fluid retention.  Get enough sleep. Practice relaxation techniques.  Drink enough fluids to keep your urine clear or pale yellow.  Take medicines as directed by your caregiver.  Limit stress.  Take a multivitamin as directed by your caregiver. Document Released: 12/13/2000 Document Revised: 09/09/2012 Document Reviewed: 05/04/2012 [facility] Patient Information 2015 [place], [place]. This information is not intended to replace advice given to you by your health  care provider. Make sure you discuss any questions you have with your health care provider.

## 2015-09-20 NOTE — Progress Notes (Signed)
Patient ID: [name], female   DOB: 05/05/1976, 39 y.o.   MRN: [id number] History of Present Illness: Capri is a 39 year old white female in for well woman gyn exam and pap.She is having some PMS symptoms, moody,headache,bloating,but no period since ablation.Also an occasional hot flash. Had labs last year.  Current Medications, Allergies, Past Medical History, Past Surgical History, Family History and Social History were reviewed in [employer] record.     Review of Systems: Patient denies any headaches, hearing loss, fatigue, blurred vision, shortness of breath, chest pain, abdominal pain, problems with bowel movements, urination, or intercourse. No joint pain, has chronic back issues, see HPI for positives.    Physical Exam:BP 110/70 mmHg  Pulse 84  Ht 5' 8.5" (1.74 m)  Wt 194 lb (87.998 kg)  BMI 29.07 kg/m2 General:  Well developed, well nourished, no acute distress Skin:  Warm and dry Neck:  Midline trachea, normal thyroid, good ROM, no lymphadenopathy Lungs; Clear to auscultation bilaterally Breast:  No dominant palpable mass, retraction, or nipple discharge Cardiovascular: Regular rate and rhythm Abdomen:  Soft, non tender, no hepatosplenomegaly Pelvic:  External genitalia is normal in appearance, no lesions.  The vagina is normal in appearance. Urethra has no lesions or masses. The cervix is bulbous. Pap with HPV performed. Uterus is felt to be normal size, shape, and contour.  No adnexal masses or tenderness noted.Bladder is non tender, no masses felt. Extremities/musculoskeletal:  No swelling or varicosities noted, no clubbing or cyanosis Psych:  No mood changes, alert and cooperative,seems happy Advised to get mammogram due to family history.  Impression: Well woman gyn exam with pap PMS    Plan: Physical in 1 year,pap in 3 if normal Mammogram advised,get 3D Rx zoloft 50 mg #30 take 1 daily with 6 refills Review handout on PMS

## 2015-09-21 LAB — CYTOLOGY - PAP

## 2015-10-20 ENCOUNTER — Ambulatory Visit
Admission: RE | Admit: 2015-10-20 | Discharge: 2015-10-20 | Disposition: A | Discharge status: Home / Self Care | Payer: 59 | Source: Ambulatory Visit

## 2015-10-20 DIAGNOSIS — Z1231 Encounter for screening mammogram for malignant neoplasm of breast: Secondary | ICD-10-CM

## 2015-10-24 ENCOUNTER — Other Ambulatory Visit: Payer: Self-pay | Admitting: Obstetrics and Gynecology

## 2015-10-24 DIAGNOSIS — R928 Other abnormal and inconclusive findings on diagnostic imaging of breast: Secondary | ICD-10-CM

## 2015-10-27 ENCOUNTER — Ambulatory Visit
Admission: RE | Admit: 2015-10-27 | Discharge: 2015-10-27 | Disposition: A | Discharge status: Home / Self Care | Payer: 59 | Source: Ambulatory Visit | Attending: Obstetrics and Gynecology | Admitting: Obstetrics and Gynecology

## 2015-10-27 ENCOUNTER — Other Ambulatory Visit: Payer: Self-pay | Admitting: Obstetrics and Gynecology

## 2015-10-27 DIAGNOSIS — N6310 Unspecified lump in the right breast, unspecified quadrant: Secondary | ICD-10-CM

## 2015-10-27 DIAGNOSIS — R928 Other abnormal and inconclusive findings on diagnostic imaging of breast: Secondary | ICD-10-CM

## 2015-10-31 ENCOUNTER — Other Ambulatory Visit: Payer: Self-pay | Admitting: Obstetrics and Gynecology

## 2015-10-31 ENCOUNTER — Ambulatory Visit
Admission: RE | Admit: 2015-10-31 | Discharge: 2015-10-31 | Disposition: A | Discharge status: Home / Self Care | Payer: 59 | Source: Ambulatory Visit | Attending: Obstetrics and Gynecology | Admitting: Obstetrics and Gynecology

## 2015-10-31 DIAGNOSIS — N6310 Unspecified lump in the right breast, unspecified quadrant: Secondary | ICD-10-CM

## 2015-10-31 DIAGNOSIS — N6001 Solitary cyst of right breast: Secondary | ICD-10-CM

## 2016-06-05 ENCOUNTER — Encounter (INDEPENDENT_AMBULATORY_CARE_PROVIDER_SITE_OTHER): Payer: Self-pay | Admitting: Internal Medicine

## 2016-06-20 ENCOUNTER — Ambulatory Visit (INDEPENDENT_AMBULATORY_CARE_PROVIDER_SITE_OTHER): Payer: 59 | Admitting: Internal Medicine

## 2016-06-20 ENCOUNTER — Encounter (INDEPENDENT_AMBULATORY_CARE_PROVIDER_SITE_OTHER): Payer: Self-pay

## 2016-06-20 ENCOUNTER — Encounter (INDEPENDENT_AMBULATORY_CARE_PROVIDER_SITE_OTHER): Payer: Self-pay | Admitting: Internal Medicine

## 2016-06-20 VITALS — BP 104/52 | HR 68 | Temp 98.6°F | Ht 68.0 in | Wt 195.0 lb

## 2016-06-20 DIAGNOSIS — R109 Unspecified abdominal pain: Secondary | ICD-10-CM

## 2016-06-20 DIAGNOSIS — R110 Nausea: Secondary | ICD-10-CM

## 2016-06-20 DIAGNOSIS — K5909 Other constipation: Secondary | ICD-10-CM | POA: Diagnosis not present

## 2016-06-20 NOTE — Patient Instructions (Signed)
CT abdomen/pelvis with CM.  

## 2016-06-20 NOTE — Progress Notes (Addendum)
Subjective:    Patient ID: [name], female    DOB: Feb 10, 1976, 40 y.o.   MRN: [id number]  HPI PCP Dr. Hilma Favors. Referred for left flank pain   If she eats a heavy meal she has the pain.  She will have spasms in her left flank. She also says she has constipation. She has had constipation for years. The pain has been for about 4 months. Occasionally has nausea after eating.  She has a BM x 1 a week. With the Miralax she may go every other day. Her appetite is good for the most part. She is not having any acid reflux. No urinary symptoms.    05/04/2016 HIDA scan: Normal GB ejection fraction.(70%).   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  Was on Xarelto x 6 months.   06/02/2014 US abdomen:  RUQ pain. ( She says actually she was hurting on her rt side).   IMPRESSION: 1. No sonographic evidence of acute cholecystitis. 2. There is a 2.2 mm non shadowing echogenic focus along the gallbladder wall which may represent a small gallbladder polyp versus a gallstone adhered to the wall.  06/2013 CT angio chest: chest pain: IMPRESSION: Study is positive for bilateral small pulmonary emboli. Largest clot burden is in the medial left lower lobe.  Critical Value/emergent results were called by telephone at the time of interpretation on 07/13/2013 at 01:06 a.m. to Dr. Betsey Holiday, who verbally acknowledged these results.  Review of Systems Past Medical History  Diagnosis Date  . Chronic low back pain   . Pulmonary embolism ([place])   . Menorrhagia 09/13/2013  . PMS (premenstrual syndrome) 09/20/2015    Past Surgical History  Procedure Laterality Date  . Appendectomy    . Laparoscopic tubal ligation Bilateral 10/01/2013    Procedure: LAPAROSCOPIC TUBAL LIGATION;  Surgeon: Florian Buff, MD;  Location: [facility];  Service: Gynecology;  Laterality: Bilateral;  . Dilitation & currettage/hystroscopy with thermachoice ablation N/A 10/01/2013    Procedure: DILATATION & CURETTAGE/HYSTEROSCOPY WITH  THERMACHOICE ABLATION;  Surgeon: Florian Buff, MD;  Location: [facility];  Service: Gynecology;  Laterality: N/A;  Total Ablation Therapy Time = 8 minutes 59 seconds    No Known Allergies  Current Outpatient Prescriptions on File Prior to Visit  Medication Sig Dispense Refill  . cyclobenzaprine (FLEXERIL) 10 MG tablet Take 10 mg by mouth as needed for muscle spasms.      No current facility-administered medications on file prior to visit.   Current Outpatient Prescriptions  Medication Sig Dispense Refill  . cyclobenzaprine (FLEXERIL) 10 MG tablet Take 10 mg by mouth as needed for muscle spasms.     Marland Kitchen dicyclomine (BENTYL) 10 MG capsule Take 10 mg by mouth as needed for spasms.    . polyethylene glycol (MIRALAX / GLYCOLAX) packet Take 17 g by mouth daily.     No current facility-administered medications for this visit.        Objective:   Physical Exam Blood pressure 104/52, pulse 68, temperature 98.6 F (37 C), height 5\' 8"  (1.727 m), weight 195 lb (88.451 kg). Alert and oriented. Skin warm and dry. Oral mucosa is moist.   . Sclera anicteric, conjunctivae is pink. Thyroid not enlarged. No cervical lymphadenopathy. Lungs clear. Heart regular rate and rhythm.  Abdomen is soft. Bowel sounds are positive. No hepatomegaly. No abdominal masses felt. Tenderness to left lower back on palpitation.   No edema to lower extremities.  Assessment & Plan:  Left lower back pain: Will get a CT abdomen/pelvis with CM. Hx of back problems.  Don't think this is a GI problem. ? Etiology.  Constipation: continue the Miralax.

## 2016-06-28 ENCOUNTER — Ambulatory Visit (HOSPITAL_COMMUNITY)
Admission: RE | Admit: 2016-06-28 | Discharge: 2016-06-28 | Disposition: A | Discharge status: Home / Self Care | Payer: 59 | Source: Ambulatory Visit | Attending: Internal Medicine | Admitting: Internal Medicine

## 2016-06-28 ENCOUNTER — Telehealth: Payer: Self-pay | Admitting: Gastroenterology

## 2016-06-28 DIAGNOSIS — K5909 Other constipation: Secondary | ICD-10-CM | POA: Diagnosis not present

## 2016-06-28 DIAGNOSIS — R918 Other nonspecific abnormal finding of lung field: Secondary | ICD-10-CM | POA: Diagnosis not present

## 2016-06-28 DIAGNOSIS — R110 Nausea: Secondary | ICD-10-CM

## 2016-06-28 DIAGNOSIS — R1011 Right upper quadrant pain: Secondary | ICD-10-CM | POA: Diagnosis present

## 2016-06-28 DIAGNOSIS — R109 Unspecified abdominal pain: Secondary | ICD-10-CM | POA: Insufficient documentation

## 2016-06-28 DIAGNOSIS — K760 Fatty (change of) liver, not elsewhere classified: Secondary | ICD-10-CM | POA: Diagnosis not present

## 2016-06-28 DIAGNOSIS — K439 Ventral hernia without obstruction or gangrene: Secondary | ICD-10-CM | POA: Insufficient documentation

## 2016-06-28 DIAGNOSIS — K5730 Diverticulosis of large intestine without perforation or abscess without bleeding: Secondary | ICD-10-CM | POA: Insufficient documentation

## 2016-06-28 MED ORDER — IOPAMIDOL (ISOVUE-300) INJECTION 61%
100.0000 mL | Freq: Once | INTRAVENOUS | Status: AC | PRN
  Administered 2016-06-28: 100 mL via INTRAVENOUS

## 2016-06-28 NOTE — Telephone Encounter (Signed)
PLEASE CALL PT. HER CT SHOWS CONSOLIDATION IN LOWER LOBE. CT ORDERED FOR ABDOMINAL PAIN. PT WILL BE REFERRED TO PCP FOR CA PNA AND ADDITIONAL MANAGEMENT.

## 2016-07-03 ENCOUNTER — Telehealth (INDEPENDENT_AMBULATORY_CARE_PROVIDER_SITE_OTHER): Payer: Self-pay | Admitting: Internal Medicine

## 2016-07-03 NOTE — Telephone Encounter (Signed)
Patient called, wants to see if her CT results are back yet.  [phone]

## 2016-07-04 NOTE — Telephone Encounter (Signed)
I have already talked with patient

## 2016-07-08 NOTE — Telephone Encounter (Signed)
Results have been given to patinet.

## 2016-07-10 ENCOUNTER — Telehealth (INDEPENDENT_AMBULATORY_CARE_PROVIDER_SITE_OTHER): Payer: Self-pay | Admitting: Internal Medicine

## 2016-07-10 ENCOUNTER — Encounter (INDEPENDENT_AMBULATORY_CARE_PROVIDER_SITE_OTHER): Payer: Self-pay | Admitting: *Deleted

## 2016-07-10 ENCOUNTER — Other Ambulatory Visit (INDEPENDENT_AMBULATORY_CARE_PROVIDER_SITE_OTHER): Payer: Self-pay | Admitting: Internal Medicine

## 2016-07-10 ENCOUNTER — Other Ambulatory Visit (INDEPENDENT_AMBULATORY_CARE_PROVIDER_SITE_OTHER): Payer: Self-pay | Admitting: *Deleted

## 2016-07-10 DIAGNOSIS — R109 Unspecified abdominal pain: Secondary | ICD-10-CM

## 2016-07-10 DIAGNOSIS — R1030 Lower abdominal pain, unspecified: Secondary | ICD-10-CM

## 2016-07-10 NOTE — Telephone Encounter (Signed)
Patient called, she stated that her PCP wants her to have a colonoscopy based on her CT results.  [phone]

## 2016-07-10 NOTE — Telephone Encounter (Signed)
Colonoscopy ordered.

## 2016-07-10 NOTE — Telephone Encounter (Signed)
Patient needs trilyte 

## 2016-07-10 NOTE — Telephone Encounter (Signed)
TCS sch'd 08/23/16, patient aware, instructions mailed

## 2016-07-10 NOTE — Telephone Encounter (Signed)
Colonoscopy at request of PCP

## 2016-07-11 MED ORDER — PEG 3350-KCL-NA BICARB-NACL 420 G PO SOLR: 4000.0000 mL | Freq: Once | ORAL | Status: DC

## 2016-07-16 ENCOUNTER — Telehealth (INDEPENDENT_AMBULATORY_CARE_PROVIDER_SITE_OTHER): Payer: Self-pay | Admitting: Internal Medicine

## 2016-07-16 NOTE — Telephone Encounter (Signed)
Hope, does not need OV

## 2016-07-16 NOTE — Progress Notes (Signed)
Patient has a procedure scheduled for 08/23/16, message sent to Terri to see if she still needs an OV at this time.

## 2016-08-23 ENCOUNTER — Ambulatory Visit (HOSPITAL_COMMUNITY)
Admission: RE | Admit: 2016-08-23 | Discharge: 2016-08-23 | Disposition: A | Discharge status: Home / Self Care | Payer: 59 | Source: Ambulatory Visit | Attending: Internal Medicine | Admitting: Internal Medicine

## 2016-08-23 ENCOUNTER — Encounter (HOSPITAL_COMMUNITY)
Admission: RE | Disposition: A | Discharge status: Home / Self Care | Payer: Self-pay | Source: Ambulatory Visit | Attending: Internal Medicine

## 2016-08-23 ENCOUNTER — Encounter (HOSPITAL_COMMUNITY): Payer: Self-pay | Admitting: *Deleted

## 2016-08-23 DIAGNOSIS — Z86711 Personal history of pulmonary embolism: Secondary | ICD-10-CM | POA: Insufficient documentation

## 2016-08-23 DIAGNOSIS — Z79899 Other long term (current) drug therapy: Secondary | ICD-10-CM | POA: Insufficient documentation

## 2016-08-23 DIAGNOSIS — K644 Residual hemorrhoidal skin tags: Secondary | ICD-10-CM | POA: Diagnosis not present

## 2016-08-23 DIAGNOSIS — R109 Unspecified abdominal pain: Secondary | ICD-10-CM

## 2016-08-23 DIAGNOSIS — F17210 Nicotine dependence, cigarettes, uncomplicated: Secondary | ICD-10-CM | POA: Diagnosis not present

## 2016-08-23 DIAGNOSIS — R1030 Lower abdominal pain, unspecified: Secondary | ICD-10-CM

## 2016-08-23 DIAGNOSIS — Z86010 Personal history of colonic polyps: Secondary | ICD-10-CM | POA: Insufficient documentation

## 2016-08-23 DIAGNOSIS — Z9049 Acquired absence of other specified parts of digestive tract: Secondary | ICD-10-CM | POA: Insufficient documentation

## 2016-08-23 DIAGNOSIS — K5900 Constipation, unspecified: Secondary | ICD-10-CM | POA: Insufficient documentation

## 2016-08-23 HISTORY — PX: COLONOSCOPY: SHX5424

## 2016-08-23 SURGERY — COLONOSCOPY
Anesthesia: Moderate Sedation

## 2016-08-23 MED ORDER — MEPERIDINE HCL 50 MG/ML IJ SOLN
INTRAMUSCULAR | Status: AC
  Filled 2016-08-23: qty 1

## 2016-08-23 MED ORDER — PSYLLIUM 28 % PO PACK: 1.0000 | PACK | Freq: Every day | ORAL | Status: DC

## 2016-08-23 MED ORDER — MEPERIDINE HCL 50 MG/ML IJ SOLN
INTRAMUSCULAR | Status: DC | PRN
  Administered 2016-08-23 (×2): 25 mg via INTRAVENOUS

## 2016-08-23 MED ORDER — MIDAZOLAM HCL 5 MG/5ML IJ SOLN
INTRAMUSCULAR | Status: DC | PRN
  Administered 2016-08-23 (×2): 2 mg via INTRAVENOUS
  Administered 2016-08-23 (×2): 1 mg via INTRAVENOUS
  Administered 2016-08-23 (×2): 2 mg via INTRAVENOUS

## 2016-08-23 MED ORDER — SODIUM CHLORIDE 0.9 % IV SOLN: INTRAVENOUS | Status: DC

## 2016-08-23 MED ORDER — MIDAZOLAM HCL 5 MG/5ML IJ SOLN
INTRAMUSCULAR | Status: AC
  Filled 2016-08-23: qty 10

## 2016-08-23 MED ORDER — STERILE WATER FOR IRRIGATION IR SOLN
Status: DC | PRN
  Administered 2016-08-23: 13:00:00

## 2016-08-23 MED ORDER — LINACLOTIDE 145 MCG PO CAPS: 145.0000 ug | ORAL_CAPSULE | Freq: Every day | ORAL | 5 refills | Status: DC

## 2016-08-23 NOTE — Discharge Instructions (Signed)
Discontinue polyethylene glycol. Resume other medications and high fiber diet. Linzess 145 g by mouth every morning. Metamucil half to 1 packet by mouth daily at bedtime. Dulcolax suppository on as-needed basis. No driving for 24 hours. Please keep stool diary as to frequency and consistency of stools for the next 4 weeks. Office visit in 4 weeks.      Colonoscopy, Care After These instructions give you information on caring for yourself after your procedure. Your doctor may also give you more specific instructions. Call your doctor if you have any problems or questions after your procedure. HOME CARE  Do not drive for 24 hours.  Do not sign important papers or use machinery for 24 hours.  You may shower.  You may go back to your usual activities, but go slower for the first 24 hours.  Take rest breaks often during the first 24 hours.  Walk around or use warm packs on your belly (abdomen) if you have belly cramping or gas.  Drink enough fluids to keep your pee (urine) clear or pale yellow.  Resume your normal diet. Avoid heavy or fried foods.  Avoid drinking alcohol for 24 hours or as told by your doctor.  Only take medicines as told by your doctor. If a tissue sample (biopsy) was taken during the procedure:   Do not take aspirin or blood thinners for 7 days, or as told by your doctor.  Do not drink alcohol for 7 days, or as told by your doctor.  Eat soft foods for the first 24 hours. GET HELP IF: You still have a small amount of blood in your poop (stool) 2-3 days after the procedure. GET HELP RIGHT AWAY IF:  You have more than a small amount of blood in your poop.  You see clumps of tissue (blood clots) in your poop.  Your belly is puffy (swollen).  You feel sick to your stomach (nauseous) or throw up (vomit).  You have a fever.  You have belly pain that gets worse and medicine does not help. MAKE SURE YOU:  Understand these instructions.  Will watch  your condition.  Will get help right away if you are not doing well or get worse.   This information is not intended to replace advice given to you by your health care provider. Make sure you discuss any questions you have with your health care provider.   Document Released: 01/18/2011 Document Revised: 12/21/2013 Document Reviewed: 08/23/2013 Elsevier Interactive Patient Education 2016 Elsevier Inc.   High-Fiber Diet Fiber, also called dietary fiber, is a type of carbohydrate found in fruits, vegetables, whole grains, and beans. A high-fiber diet can have many health benefits. Your health care provider may recommend a high-fiber diet to help: Prevent constipation. Fiber can make your bowel movements more regular. Lower your cholesterol. Relieve hemorrhoids, uncomplicated diverticulosis, or irritable bowel syndrome. Prevent overeating as part of a weight-loss plan. Prevent heart disease, type 2 diabetes, and certain cancers. WHAT IS MY PLAN? The recommended daily intake of fiber includes: 38 grams for men under age 82. 47 grams for men over age 57. 36 grams for women under age 41. 74 grams for women over age 41. You can get the recommended daily intake of dietary fiber by eating a variety of fruits, vegetables, grains, and beans. Your health care provider may also recommend a fiber supplement if it is not possible to get enough fiber through your diet. WHAT DO I NEED TO KNOW ABOUT A HIGH-FIBER DIET? Fiber supplements  have not been widely studied for their effectiveness, so it is better to get fiber through food sources. Always check the fiber content on thenutrition facts label of any prepackaged food. Look for foods that contain at least 5 grams of fiber per serving. Ask your dietitian if you have questions about specific foods that are related to your condition, especially if those foods are not listed in the following section. Increase your daily fiber consumption gradually.  Increasing your intake of dietary fiber too quickly may cause bloating, cramping, or gas. Drink plenty of water. Water helps you to digest fiber. WHAT FOODS CAN I EAT? Grains Whole-grain breads. Multigrain cereal. Oats and oatmeal. Brown rice. Barley. Bulgur wheat. [place]. Bran muffins. Popcorn. Rye wafer crackers. Vegetables Sweet potatoes. Spinach. Kale. Artichokes. Cabbage. Broccoli. Green peas. Carrots. Squash. Fruits Berries. Pears. Apples. Oranges. Avocados. Prunes and raisins. Dried figs. Meats and Other Protein Sources [place], kidney, pinto, and soy beans. Split peas. Lentils. Nuts and seeds. Dairy Fiber-fortified yogurt. Beverages Fiber-fortified soy milk. Fiber-fortified orange juice. Other Fiber bars. The items listed above may not be a complete list of recommended foods or beverages. Contact your dietitian for more options. WHAT FOODS ARE NOT RECOMMENDED? Grains White bread. Pasta made with refined flour. White rice. Vegetables Fried potatoes. Canned vegetables. Well-cooked vegetables.  Fruits Fruit juice. Cooked, strained fruit. Meats and Other Protein Sources Fatty cuts of meat. Fried Sales executive or fried fish. Dairy Milk. Yogurt. Cream cheese. Sour cream. Beverages Soft drinks. Other Cakes and pastries. Butter and oils. The items listed above may not be a complete list of foods and beverages to avoid. Contact your dietitian for more information. WHAT ARE SOME TIPS FOR INCLUDING HIGH-FIBER FOODS IN MY DIET? Eat a wide variety of high-fiber foods. Make sure that half of all grains consumed each day are whole grains. Replace breads and cereals made from refined flour or white flour with whole-grain breads and cereals. Replace white rice with brown rice, bulgur wheat, or millet. Start the day with a breakfast that is high in fiber, such as a cereal that contains at least 5 grams of fiber per serving. Use beans in place of meat in soups, salads, or pasta. Eat high-fiber  snacks, such as berries, raw vegetables, nuts, or popcorn.   This information is not intended to replace advice given to you by your health care provider. Make sure you discuss any questions you have with your health care provider.   Document Released: 12/16/2005 Document Revised: 01/06/2015 Document Reviewed: 05/31/2014 Elsevier Interactive Patient Education [employer].

## 2016-08-23 NOTE — H&P (Signed)
[name] is an 40 y.o. female.   Chief Complaint: Patient is here for colonoscopy. HPI: She is 40 year old Caucasian female was an irregular bowel movements most of her life with occasional need for laxative. She's had problems in the last 6 months. She started with severe constipation left-sided abdominal pain she had abdominopelvic CT in June revealing constipation but no wall thickening or other abnormalities. She denies rectal bleeding anorexia weight loss. Last colonoscopy was in March 2009 with removal of small hyperplastic polyp. Family history is negative for CRC or IBD.  Past Medical History:  Diagnosis Date  . Chronic low back pain   . Menorrhagia 09/13/2013  . PMS (premenstrual syndrome) 09/20/2015  . Pulmonary embolism [facility])     Past Surgical History:  Procedure Laterality Date  . APPENDECTOMY    . DILITATION & CURRETTAGE/HYSTROSCOPY WITH THERMACHOICE ABLATION N/A 10/01/2013   Procedure: DILATATION & CURETTAGE/HYSTEROSCOPY WITH THERMACHOICE ABLATION;  Surgeon: Florian Buff, MD;  Location: [facility];  Service: Gynecology;  Laterality: N/A;  Total Ablation Therapy Time = 8 minutes 59 seconds  . LAPAROSCOPIC TUBAL LIGATION Bilateral 10/01/2013   Procedure: LAPAROSCOPIC TUBAL LIGATION;  Surgeon: Florian Buff, MD;  Location: [facility];  Service: Gynecology;  Laterality: Bilateral;    Family History  Problem Relation Age of Onset  . Breast cancer Mother   . COPD Mother   . Heart Problems Father   . Cancer Maternal Grandmother     breast  . Breast cancer Maternal Aunt   . Cancer Maternal Aunt     ovarian   Social History:  reports that she has been smoking Cigarettes.  She has a 8.50 pack-year smoking history. She has never used smokeless tobacco. She reports that she drinks alcohol. She reports that she does not use drugs.  Allergies: No Known Allergies  Medications Prior to Admission  Medication Sig Dispense Refill  . cyclobenzaprine (FLEXERIL) 10 MG tablet Take 10 mg by  mouth as needed for muscle spasms.     Marland Kitchen dicyclomine (BENTYL) 10 MG capsule Take 10 mg by mouth as needed for spasms.    . polyethylene glycol (MIRALAX / GLYCOLAX) packet Take 17 g by mouth daily.    . polyethylene glycol-electrolytes (TRILYTE) 420 g solution Take 4,000 mLs by mouth once. 4000 mL 0  . traMADol (ULTRAM) 50 MG tablet Take 50 mg by mouth every 6 (six) hours as needed for moderate pain.      No results found for this or any previous visit (from the past 48 hour(s)). No results found.  ROS  Blood pressure 102/74, pulse 67, temperature 98.2 F (36.8 C), temperature source Oral, resp. rate 14, height 5\' 8"  (1.727 m), weight 190 lb (86.2 kg), SpO2 97 %. Physical Exam  Constitutional: She appears well-developed and well-nourished.  HENT:  Mouth/Throat: Oropharynx is clear and moist.  Eyes: Conjunctivae are normal. No scleral icterus.  Neck: No thyromegaly present.  Cardiovascular: Normal rate, regular rhythm and normal heart sounds.   No murmur heard. Respiratory: Effort normal and breath sounds normal.  GI: She exhibits no distension and no mass. There is no tenderness.  Musculoskeletal: She exhibits no edema.  Lymphadenopathy:    She has no cervical adenopathy.  Neurological: She is alert.  Skin: Skin is warm and dry.     Assessment/Plan Constipation and left-sided abdominal pain. Diagnostic colonoscopy.  Hildred Laser, MD 08/23/2016, 12:26 PM

## 2016-08-23 NOTE — Op Note (Signed)
[facility] Patient Name: [name] Procedure Date: 08/23/2016 11:59 AM MRN: [id number] Date of Birth: [birth year] Attending MD: Hildred Laser , MD CSN: [id number] Age: 40 Admit Type: Outpatient Procedure:                Colonoscopy Indications:              Abdominal pain, Constipation Providers:                Hildred Laser, MD, Otis Peak B. Sharon Seller, RN, Randa Spike, Technician Referring MD:             Halford Chessman, MD Medicines:                Meperidine 50 mg IV, Midazolam 10 mg IV Complications:            No immediate complications. Estimated Blood Loss:     Estimated blood loss: none. Procedure:                Pre-Anesthesia Assessment:                           - Prior to the procedure, a History and Physical                            was performed, and patient medications and                            allergies were reviewed. The patient's tolerance of                            previous anesthesia was also reviewed. The risks                            and benefits of the procedure and the sedation                            options and risks were discussed with the patient.                            All questions were answered, and informed consent                            was obtained. Prior Anticoagulants: The patient has                            taken no previous anticoagulant or antiplatelet                            agents. ASA Grade Assessment: I - A normal, healthy                            patient. After reviewing the risks and benefits,  the patient was deemed in satisfactory condition to                            undergo the procedure.                           After obtaining informed consent, the colonoscope                            was passed under direct vision. Throughout the                            procedure, the patient's blood pressure, pulse, and   oxygen saturations were monitored continuously. The                            [id number]) scope was introduced through                            the anus and advanced to the the cecum, identified                            by appendiceal orifice and ileocecal valve. The                            ileocecal valve, appendiceal orifice, and rectum                            were photographed. The quality of the bowel                            preparation was good. Scope In: 12:35:24 PM Scope Out: 1:03:23 PM Scope Withdrawal Time: 0 hours 6 minutes 21 seconds  Total Procedure Duration: 0 hours 27 minutes 59 seconds  Findings:      The colon (entire examined portion) appeared normal.      External hemorrhoids were found during retroflexion. The hemorrhoids       were small. Impression:               - The entire examined colon is normal.                           - External hemorrhoids.                           - No specimens collected.                           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 Moderate Sedation:      Moderate (conscious) sedation was administered by the endoscopy nurse       and supervised by the endoscopist. The following parameters were       monitored: oxygen saturation, heart rate, blood pressure, CO2       capnography and response to care. Total physician intraservice time was       32 minutes. Recommendation:           -  Patient has a contact number available for                            emergencies. The signs and symptoms of potential                            delayed complications were discussed with the                            patient. Return to normal activities tomorrow.                            Written discharge instructions were provided to the                            patient.                           - High fiber diet today.                           - Discontinue laxatives.                           - Use Linzess  (linaclotide) 145 mcg PO daily.                           - Use original regular Metamucil half to 1 packet                            by mouth daily. PO daily.                           - Return to GI clinic in 4 weeks.                           - If the pathology report is benign, then repeat                            colonoscopy for screening purposes in 11 years. Procedure Code(s):        --- Professional ---                           [phone], Colonoscopy, flexible; diagnostic, including                            collection of specimen(s) by brushing or washing,                            when performed (separate procedure)                           99152, Moderate sedation services provided by the  same physician or other qualified health care                            professional performing the diagnostic or                            therapeutic service that the sedation supports,                            requiring the presence of an independent trained                            observer to assist in the monitoring of the                            patient's level of consciousness and physiological                            status; initial 15 minutes of intraservice time,                            patient age 39 years or older                           [phone], Moderate sedation services; each additional                            15 minutes intraservice time Diagnosis Code(s):        --- Professional ---                           K64.4, Residual hemorrhoidal skin tags                           R10.9, Unspecified abdominal pain                           K59.00, Constipation, unspecified CPT copyright 2016 American Medical Association. All rights reserved. The codes documented in this report are preliminary and upon coder review may  be revised to meet current compliance requirements. Hildred Laser, MD Hildred Laser, MD 08/23/2016 1:15:50 PM This  report has been signed electronically. Number of Addenda: 0

## 2016-08-26 ENCOUNTER — Encounter (INDEPENDENT_AMBULATORY_CARE_PROVIDER_SITE_OTHER): Payer: Self-pay | Admitting: Internal Medicine

## 2016-08-27 ENCOUNTER — Encounter (HOSPITAL_COMMUNITY): Payer: Self-pay | Admitting: Internal Medicine

## 2016-09-20 ENCOUNTER — Ambulatory Visit (INDEPENDENT_AMBULATORY_CARE_PROVIDER_SITE_OTHER): Payer: 59 | Admitting: Internal Medicine

## 2016-09-25 ENCOUNTER — Other Ambulatory Visit: Payer: 59 | Admitting: Adult Health

## 2016-10-01 ENCOUNTER — Encounter (INDEPENDENT_AMBULATORY_CARE_PROVIDER_SITE_OTHER): Payer: Self-pay | Admitting: Internal Medicine

## 2016-10-01 ENCOUNTER — Other Ambulatory Visit (INDEPENDENT_AMBULATORY_CARE_PROVIDER_SITE_OTHER): Payer: Self-pay | Admitting: Internal Medicine

## 2016-10-01 ENCOUNTER — Ambulatory Visit (INDEPENDENT_AMBULATORY_CARE_PROVIDER_SITE_OTHER): Payer: 59 | Admitting: Internal Medicine

## 2016-10-01 ENCOUNTER — Encounter (INDEPENDENT_AMBULATORY_CARE_PROVIDER_SITE_OTHER): Payer: Self-pay | Admitting: *Deleted

## 2016-10-01 VITALS — BP 102/70 | HR 66 | Temp 98.5°F | Resp 18 | Ht 68.0 in | Wt 206.9 lb

## 2016-10-01 DIAGNOSIS — R109 Unspecified abdominal pain: Secondary | ICD-10-CM

## 2016-10-01 DIAGNOSIS — K824 Cholesterolosis of gallbladder: Secondary | ICD-10-CM

## 2016-10-01 DIAGNOSIS — K5909 Other constipation: Secondary | ICD-10-CM

## 2016-10-01 MED ORDER — POLYETHYLENE GLYCOL 3350 17 GM/SCOOP PO POWD: 8.5000 g | Freq: Every day | ORAL | 0 refills | Status: DC | PRN

## 2016-10-01 NOTE — Patient Instructions (Signed)
Upper abdominal ultrasound to be scheduled. 

## 2016-10-01 NOTE — Progress Notes (Signed)
Presenting complaint;  Follow-up for constipation. Recurrent postprandial left flank pain.  Database and Subjective:  Patient is 40 year old Caucasian female who was evaluated in June this year with F Saturday abdominal pain and worsening constipation. Abdominopelvic CT was unremarkable. She underwent colonoscopy on 08/23/2016 revealing external hemorrhoids. 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 She stopped Linzess after few doses because she felt bad and dizzy. She states she is having bowel movements daily and/or every other day. She is consuming fiber rich foods. She has heartburn at a frequency of less than 3 times a week. She still complains of not able to eat full meal and becomes full very quickly. She also feels discomfort in left flank region after meals. It generally occurs after lunch and supper and not after breakfast. This discomfort may last for 30 minutes or longer. She took meloxicam last month and developed nausea. She is not taking this medication anymore. She has gained 11 pounds in 3-1/2 months.   Current Medications: Outpatient Encounter Prescriptions as of 10/01/2016  Medication Sig  . cyclobenzaprine (FLEXERIL) 10 MG tablet Take 10 mg by mouth as needed for muscle spasms.   Marland Kitchen psyllium (METAMUCIL SMOOTH TEXTURE) 28 % packet Take 1 packet by mouth at bedtime.  . traMADol (ULTRAM) 50 MG tablet Take 50 mg by mouth every 6 (six) hours as needed for moderate pain.  . [DISCONTINUED] dicyclomine (BENTYL) 10 MG capsule Take 10 mg by mouth as needed for spasms.  . [DISCONTINUED] linaclotide (LINZESS) 145 MCG CAPS capsule Take 1 capsule (145 mcg total) by mouth daily. (Patient not taking: Reported on 10/01/2016)   No facility-administered encounter medications on file as of 10/01/2016.      Objective: Blood pressure 102/70, pulse 66, temperature 98.5 F (36.9 C), temperature source Oral, resp. rate 18, height 5\' 8"  (1.727 m), weight 206 lb 14.4 oz (93.8  kg). Patient is alert and in no acute distress. Conjunctiva is pink. Sclera is nonicteric Oropharyngeal mucosa is normal. No neck masses or thyromegaly noted. Cardiac exam with regular rhythm normal S1 and S2. No murmur or gallop noted. Lungs are clear to auscultation. Abdomen;  No LE edema or clubbing noted.  Lab studies reviewed She had ultrasound on 06/02/2014 which revealed small echogenic focus in gallbladder consistent with a polyp.  Abdominopelvic CT with contrast on 06/28/2016 Revealed stool throughout the colon small ventral hernia containing fat and hepatic steatosis.  Assessment:  #1. Chronic constipation. She is doing better with Metamucil and she can use polyethylene glycol on as-needed basis or 2-3 times a week at a reduced dose. #2. Gallbladder polyp. Follow-up study indicated to make sure polyp is not enlarging. #3. Postprandial left flank pain. CT earlier this year was negative for pancreatic disease. It remains to be seen if she has gallbladder disease.    Plan:  Continue high fiber diet and Metamucil.. Polyethylene glycol 8.5 g by mouth daily when necessary or 2-3 times a week. Upper abdominal ultrasound. She will keep symptom diary for the next couple weeks. 3 months.

## 2016-10-07 ENCOUNTER — Ambulatory Visit (HOSPITAL_COMMUNITY)
Admission: RE | Admit: 2016-10-07 | Discharge: 2016-10-07 | Disposition: A | Discharge status: Home / Self Care | Payer: 59 | Source: Ambulatory Visit | Attending: Internal Medicine | Admitting: Internal Medicine

## 2016-10-07 ENCOUNTER — Ambulatory Visit (INDEPENDENT_AMBULATORY_CARE_PROVIDER_SITE_OTHER): Payer: 59 | Admitting: Adult Health

## 2016-10-07 ENCOUNTER — Encounter: Payer: Self-pay | Admitting: Adult Health

## 2016-10-07 VITALS — BP 110/62 | HR 70 | Ht 68.25 in | Wt 205.5 lb

## 2016-10-07 DIAGNOSIS — Z01419 Encounter for gynecological examination (general) (routine) without abnormal findings: Secondary | ICD-10-CM | POA: Diagnosis not present

## 2016-10-07 DIAGNOSIS — R109 Unspecified abdominal pain: Secondary | ICD-10-CM | POA: Diagnosis present

## 2016-10-07 DIAGNOSIS — K824 Cholesterolosis of gallbladder: Secondary | ICD-10-CM

## 2016-10-07 NOTE — Progress Notes (Signed)
Patient ID: [name], female   DOB: Jul 25, 1976, 40 y.o.   MRN: [id number] History of Present Illness: [name] is a 40 year old white female, married in for a well woman gyn exam, she had a normal pap with negative HPV 09/20/15. PCP is Dr Hilma Favors.   Current Medications, Allergies, Past Medical History, Past Surgical History, Family History and Social History were reviewed in [employer] record.     Review of Systems: Patient denies any headaches, hearing loss, fatigue, blurred vision, shortness of breath, chest pain, abdominal pain, problems with bowel movements, urination, or intercourse. No joint pain or mood swings.Has been having left flank pain since February, has seen Dr Laural Golden and had colonoscopy and then GB US today.Was treated for pneumonia in the June.    Physical Exam:BP 110/62 (BP Location: Left Arm, Patient Position: Sitting, Cuff Size: Large)   Pulse 70   Ht 5' 8.25" (1.734 m)   Wt 205 lb 8 oz (93.2 kg)   BMI 31.02 kg/m  General:  Well developed, well nourished, no acute distress Skin:  Warm and dry Neck:  Midline trachea, normal thyroid, good ROM, no lymphadenopathy Lungs; Clear to auscultation bilaterally Breast:  No dominant palpable mass, retraction, or nipple discharge Cardiovascular: Regular rate and rhythm Abdomen:  Soft, non tender, no hepatosplenomegaly Pelvic:  External genitalia is normal in appearance, no lesions.  The vagina is normal in appearance. Urethra has no lesions or masses. The cervix is bulbous.  Uterus is felt to be normal size, shape, and contour.  No adnexal masses or tenderness noted.Bladder is non tender, no masses felt. Extremities/musculoskeletal:  No swelling or varicosities noted, no clubbing or cyanosis Psych:  No mood changes, alert and cooperative,seems happy PHQ 2 score 0.  Impression: 1. Well woman exam with routine gynecological exam       Plan: Check CBC,CMP,TSH and lipids,A1c and vitamin D  Physical  in 1 year, pap 2019 Get mammogram

## 2016-10-07 NOTE — Patient Instructions (Addendum)
Physical in 1 year, pap 2019 Get mammogram

## 2016-10-10 ENCOUNTER — Encounter (INDEPENDENT_AMBULATORY_CARE_PROVIDER_SITE_OTHER): Payer: Self-pay | Admitting: Internal Medicine

## 2016-10-11 ENCOUNTER — Other Ambulatory Visit: Payer: Self-pay | Admitting: Obstetrics and Gynecology

## 2016-10-11 DIAGNOSIS — Z1231 Encounter for screening mammogram for malignant neoplasm of breast: Secondary | ICD-10-CM

## 2016-10-25 ENCOUNTER — Ambulatory Visit
Admission: RE | Admit: 2016-10-25 | Discharge: 2016-10-25 | Disposition: A | Discharge status: Home / Self Care | Payer: 59 | Source: Ambulatory Visit | Attending: Obstetrics and Gynecology | Admitting: Obstetrics and Gynecology

## 2016-10-25 DIAGNOSIS — Z1231 Encounter for screening mammogram for malignant neoplasm of breast: Secondary | ICD-10-CM

## 2016-10-30 ENCOUNTER — Other Ambulatory Visit: Payer: Self-pay | Admitting: Obstetrics and Gynecology

## 2016-10-30 DIAGNOSIS — R928 Other abnormal and inconclusive findings on diagnostic imaging of breast: Secondary | ICD-10-CM

## 2016-11-04 ENCOUNTER — Other Ambulatory Visit: Payer: Self-pay | Admitting: Obstetrics and Gynecology

## 2016-11-04 ENCOUNTER — Ambulatory Visit
Admission: RE | Admit: 2016-11-04 | Discharge: 2016-11-04 | Disposition: A | Discharge status: Home / Self Care | Payer: 59 | Source: Ambulatory Visit | Attending: Obstetrics and Gynecology | Admitting: Obstetrics and Gynecology

## 2016-11-04 DIAGNOSIS — R928 Other abnormal and inconclusive findings on diagnostic imaging of breast: Secondary | ICD-10-CM

## 2016-11-04 DIAGNOSIS — N6320 Unspecified lump in the left breast, unspecified quadrant: Secondary | ICD-10-CM

## 2016-11-05 ENCOUNTER — Ambulatory Visit
Admission: RE | Admit: 2016-11-05 | Discharge: 2016-11-05 | Disposition: A | Discharge status: Home / Self Care | Payer: 59 | Source: Ambulatory Visit | Attending: Obstetrics and Gynecology | Admitting: Obstetrics and Gynecology

## 2016-11-05 ENCOUNTER — Other Ambulatory Visit: Payer: Self-pay | Admitting: Obstetrics and Gynecology

## 2016-11-05 DIAGNOSIS — N6320 Unspecified lump in the left breast, unspecified quadrant: Secondary | ICD-10-CM

## 2016-11-08 ENCOUNTER — Encounter: Payer: Self-pay | Admitting: Adult Health

## 2016-12-30 HISTORY — PX: BREAST EXCISIONAL BIOPSY: SUR124

## 2017-01-07 ENCOUNTER — Encounter (INDEPENDENT_AMBULATORY_CARE_PROVIDER_SITE_OTHER): Payer: Self-pay | Admitting: Internal Medicine

## 2017-01-07 ENCOUNTER — Ambulatory Visit (INDEPENDENT_AMBULATORY_CARE_PROVIDER_SITE_OTHER): Payer: 59 | Admitting: Internal Medicine

## 2017-04-07 ENCOUNTER — Other Ambulatory Visit: Payer: Self-pay | Admitting: Obstetrics and Gynecology

## 2017-04-07 ENCOUNTER — Encounter: Payer: Self-pay | Admitting: Adult Health

## 2017-04-07 DIAGNOSIS — Z1231 Encounter for screening mammogram for malignant neoplasm of breast: Secondary | ICD-10-CM

## 2017-05-13 DIAGNOSIS — J069 Acute upper respiratory infection, unspecified: Secondary | ICD-10-CM | POA: Diagnosis not present

## 2017-05-15 DIAGNOSIS — S336XXA Sprain of sacroiliac joint, initial encounter: Secondary | ICD-10-CM | POA: Diagnosis not present

## 2017-05-15 DIAGNOSIS — S335XXA Sprain of ligaments of lumbar spine, initial encounter: Secondary | ICD-10-CM | POA: Diagnosis not present

## 2017-05-21 DIAGNOSIS — S335XXA Sprain of ligaments of lumbar spine, initial encounter: Secondary | ICD-10-CM | POA: Diagnosis not present

## 2017-05-21 DIAGNOSIS — S336XXA Sprain of sacroiliac joint, initial encounter: Secondary | ICD-10-CM | POA: Diagnosis not present

## 2017-06-08 DIAGNOSIS — S134XXA Sprain of ligaments of cervical spine, initial encounter: Secondary | ICD-10-CM | POA: Diagnosis not present

## 2017-06-08 DIAGNOSIS — M546 Pain in thoracic spine: Secondary | ICD-10-CM | POA: Diagnosis not present

## 2017-06-08 DIAGNOSIS — S336XXA Sprain of sacroiliac joint, initial encounter: Secondary | ICD-10-CM | POA: Diagnosis not present

## 2017-08-17 DIAGNOSIS — S233XXA Sprain of ligaments of thoracic spine, initial encounter: Secondary | ICD-10-CM | POA: Diagnosis not present

## 2017-08-17 DIAGNOSIS — S134XXA Sprain of ligaments of cervical spine, initial encounter: Secondary | ICD-10-CM | POA: Diagnosis not present

## 2017-08-17 DIAGNOSIS — S336XXA Sprain of sacroiliac joint, initial encounter: Secondary | ICD-10-CM | POA: Diagnosis not present

## 2017-08-24 DIAGNOSIS — S233XXA Sprain of ligaments of thoracic spine, initial encounter: Secondary | ICD-10-CM | POA: Diagnosis not present

## 2017-08-24 DIAGNOSIS — S336XXA Sprain of sacroiliac joint, initial encounter: Secondary | ICD-10-CM | POA: Diagnosis not present

## 2017-08-24 DIAGNOSIS — S134XXA Sprain of ligaments of cervical spine, initial encounter: Secondary | ICD-10-CM | POA: Diagnosis not present

## 2017-09-26 ENCOUNTER — Encounter (INDEPENDENT_AMBULATORY_CARE_PROVIDER_SITE_OTHER): Payer: Self-pay | Admitting: *Deleted

## 2017-10-27 ENCOUNTER — Ambulatory Visit
Admission: RE | Admit: 2017-10-27 | Discharge: 2017-10-27 | Disposition: A | Discharge status: Home / Self Care | Payer: 59 | Source: Ambulatory Visit | Attending: Obstetrics and Gynecology | Admitting: Obstetrics and Gynecology

## 2017-10-27 DIAGNOSIS — Z1231 Encounter for screening mammogram for malignant neoplasm of breast: Secondary | ICD-10-CM | POA: Diagnosis not present

## 2017-10-28 ENCOUNTER — Other Ambulatory Visit: Payer: Self-pay | Admitting: Obstetrics and Gynecology

## 2017-10-28 DIAGNOSIS — N6320 Unspecified lump in the left breast, unspecified quadrant: Secondary | ICD-10-CM

## 2017-10-31 ENCOUNTER — Ambulatory Visit
Admission: RE | Admit: 2017-10-31 | Discharge: 2017-10-31 | Disposition: A | Discharge status: Home / Self Care | Payer: 59 | Source: Ambulatory Visit | Attending: Obstetrics and Gynecology | Admitting: Obstetrics and Gynecology

## 2017-10-31 DIAGNOSIS — R928 Other abnormal and inconclusive findings on diagnostic imaging of breast: Secondary | ICD-10-CM | POA: Diagnosis not present

## 2017-10-31 DIAGNOSIS — N6320 Unspecified lump in the left breast, unspecified quadrant: Secondary | ICD-10-CM

## 2017-10-31 DIAGNOSIS — N6324 Unspecified lump in the left breast, lower inner quadrant: Secondary | ICD-10-CM | POA: Diagnosis not present

## 2017-11-14 ENCOUNTER — Ambulatory Visit: Payer: Self-pay | Admitting: Surgery

## 2017-11-14 ENCOUNTER — Other Ambulatory Visit: Payer: Self-pay | Admitting: Surgery

## 2017-11-14 DIAGNOSIS — N6324 Unspecified lump in the left breast, lower inner quadrant: Secondary | ICD-10-CM | POA: Diagnosis not present

## 2017-11-14 DIAGNOSIS — N6320 Unspecified lump in the left breast, unspecified quadrant: Secondary | ICD-10-CM

## 2017-11-14 DIAGNOSIS — Z803 Family history of malignant neoplasm of breast: Secondary | ICD-10-CM | POA: Diagnosis not present

## 2017-11-14 NOTE — H&P (View-Only) (Signed)
[name] 11/14/2017 10:46 AM Location: [facility] Patient #: [id number] DOB: 03-Nov-1976 Married / Language: [name] / Race: White Female  History of Present Illness [name] [name] MD; 11/14/2017 11:24 AM) Patient words: Patient said at the request of Dr. Milon Dikes for left breast mass. A 1.8 cm mass was discovered in 2017. This her left breast in the lower inner quadrant. Core biopsy showed fibroadenoma. On follow-up this is increased in size. The patient is a strong family history of breast cancer and ovarian cancer. She desires excision. It is not causing any pain. She denies any breast masses, nipple discharge or other problem with either breast.        Diagnosis Breast, left, needle core biopsy, 8 o'clock - FIBROADENOMA. - THERE IS NO EVIDENCE OF MALIGNANCY. - SEE COMMENT. Microscopic Comment [facility]: Left breast 8 o'clock mass, which per prior biopsy results represents a fibroadenoma has increased in size.  EXAM: 2D DIGITAL DIAGNOSTIC LEFT MAMMOGRAM WITH CAD AND ADJUNCT TOMO  ULTRASOUND LEFT BREAST  COMPARISON: Previous exam(s).  ACR Breast Density Category b: There are scattered areas of fibroglandular density.  FINDINGS: Additional mammographic views of the left breast demonstrate interval significant increase in size of the moderately dense macrolobulated mass in the left breast slightly lower inner quadrant, middle depth. Post biopsy marker is located within this mass.  Mammographic images were processed with CAD.  On physical exam, no suspicious masses are palpated.  Targeted ultrasound is performed, showing left breast 8 o'clock 1 cm from the nipple hypoechoic lobulated mass which contains a post biopsy tissue marker. It measures 1.3 x 0.9 x 1.4 cm. The prior sonographic measurements of this mass were 0.9 x 0.6 x 0.8 cm.  IMPRESSION: Interval increase in size of  the left breast 8 o'clock mass.  RECOMMENDATION: Surgical consultation to consider excisional biopsy.  I have discussed the findings and recommendations with the patient. Results were also provided in writing at the conclusion of the visit. If applicable, a reminder letter will be sent to the patient regarding the next appointment.  BI-RADS CATEGORY 4: Suspicious.   Electronically Signed By: [name] M.D.             Diagnosis Breast, left, needle core biopsy, 8 o'clock - FIBROADENOMA. - THERE IS NO EVIDENCE OF MALIGNANCY. - SEE COMMENT. Microscopic Comment The results were called to the [facility].  The patient is a 41 year old female.   Past Surgical History [name], [place]; 11/14/2017 10:46 AM) Appendectomy Breast Biopsy Left.  Diagnostic Studies History [name], [place]; 11/14/2017 10:46 AM) Colonoscopy 1-5 years ago Mammogram within last year Pap Smear 1-5 years ago  Allergies [name], RMA; 11/14/2017 10:47 AM) No Known Allergies 11/14/2017  Medication History [name], RMA; 11/14/2017 10:47 AM) Flexeril (10MG  Tablet, Oral) Active. MiraLax (Oral as needed) Active. Metamucil (Oral) Specific strength unknown - Active. Medications Reconciled  Social History [name], [place]; 11/14/2017 10:46 AM) Alcohol use Occasional alcohol use. Caffeine use Carbonated beverages, Coffee, Tea. Illicit drug use Remotely quit drug use. Tobacco use Current every day smoker.  Family History [name], [place]; 11/14/2017 10:46 AM) Anesthetic complications Mother. Arthritis Father. Breast Cancer Family Members In General, Mother. Cervical Cancer Family Members In General. Depression Mother. Migraine Headache Brother, Mother. Ovarian Cancer Family Members In General. Respiratory Condition Mother.  Pregnancy / Birth History [name], [place]; 11/14/2017 10:46 AM) Age  at menarche 79 years.  Contraceptive History Oral contraceptives. Gravida 3 Irregular periods Maternal age 34-25 Para 2  Other Problems [name], [place]; 11/14/2017 10:46 AM) Back Pain Migraine Headache Pulmonary Embolism / Blood Clot in Legs     Review of Systems [name] RMA; 11/14/2017 10:47 AM) General Not Present- Appetite Loss, Chills, Fatigue, Fever, Night Sweats, Weight Gain and Weight Loss. Skin Not Present- Change in Wart/Mole, Dryness, Hives, Jaundice, New Lesions, Non-Healing Wounds, Rash and Ulcer. HEENT Not Present- Earache, Hearing Loss, Hoarseness, Nose Bleed, Oral Ulcers, Ringing in the Ears, Seasonal Allergies, Sinus Pain, Sore Throat, Visual Disturbances, Wears glasses/contact lenses and Yellow Eyes. Respiratory Not Present- Bloody sputum, Chronic Cough, Difficulty Breathing, Snoring and Wheezing. Breast Present- Breast Mass. Not Present- Breast Pain, Nipple Discharge and Skin Changes. Cardiovascular Not Present- Chest Pain, Difficulty Breathing Lying Down, Leg Cramps, Palpitations, Rapid Heart Rate, Shortness of Breath and Swelling of Extremities. Gastrointestinal Not Present- Abdominal Pain, Bloating, Bloody Stool, Change in Bowel Habits, Chronic diarrhea, Constipation, Difficulty Swallowing, Excessive gas, Gets full quickly at meals, Hemorrhoids, Indigestion, Nausea, Rectal Pain and Vomiting. Female Genitourinary Not Present- Frequency, Nocturia, Painful Urination, Pelvic Pain and Urgency. Musculoskeletal Not Present- Back Pain, Joint Pain, Joint Stiffness, Muscle Pain, Muscle Weakness and Swelling of Extremities. Neurological Not Present- Decreased Memory, Fainting, Headaches, Numbness, Seizures, Tingling, Tremor, Trouble walking and Weakness. Psychiatric Not Present- Anxiety, Bipolar, Change in Sleep Pattern, Depression, Fearful and Frequent crying. Endocrine Not Present- Cold Intolerance, Excessive Hunger, Hair Changes, Heat Intolerance, Hot  flashes and New Diabetes. Hematology Not Present- Blood Thinners, Easy Bruising, Excessive bleeding, Gland problems, HIV and Persistent Infections.  Vitals [name] RMA; 11/14/2017 10:48 AM) 11/14/2017 10:47 AM Weight: 218.8 lb Height: 70in Body Surface Area: 2.17 m Body Mass Index: 31.39 kg/m  Temp.: 98.30F  Pulse: 89 (Regular)  BP: 112/70 (Sitting, Left Arm, Standard)      Physical Exam (Veneda Kirksey A. Marquasia Schmieder MD; 11/14/2017 11:25 AM)  General Mental Status-Alert. General Appearance-Consistent with stated age. Hydration-Well hydrated. Voice-Normal.  Head and Neck Head-normocephalic, atraumatic with no lesions or palpable masses. Trachea-midline. Thyroid Gland Characteristics - normal size and consistency.  Eye Eyeball - Bilateral-Extraocular movements intact. Sclera/Conjunctiva - Bilateral-No scleral icterus.  Chest and Lung Exam Chest and lung exam reveals -quiet, even and easy respiratory effort with no use of accessory muscles and on auscultation, normal breath sounds, no adventitious sounds and normal vocal resonance. Inspection Chest Wall - Normal. Back - normal.  Breast Breast - Left-Symmetric, Non Tender, No Biopsy scars, no Dimpling, No Inflammation, No Lumpectomy scars, No Mastectomy scars, No Peau d' Orange. Breast - Right-Symmetric, Non Tender, No Biopsy scars, no Dimpling, No Inflammation, No Lumpectomy scars, No Mastectomy scars, No Peau d' Orange. Breast Lump-No Palpable Breast Mass.  Cardiovascular Cardiovascular examination reveals -normal heart sounds, regular rate and rhythm with no murmurs and normal pedal pulses bilaterally.  Neurologic Neurologic evaluation reveals -alert and oriented x 3 with no impairment of recent or remote memory. Mental Status-Normal.  Musculoskeletal Normal Exam - Left-Upper Extremity Strength Normal and Lower Extremity Strength Normal. Normal Exam - Right-Upper  Extremity Strength Normal and Lower Extremity Strength Normal.  Lymphatic Head & Neck  General Head & Neck Lymphatics: Bilateral - Description - Normal. Axillary  General Axillary Region: Bilateral - Description - Normal. Tenderness - Non Tender.    Assessment & Plan (Juana Haralson A. Alixander Rallis MD; 11/14/2017 11:25 AM)  LEFT BREAST MASS (N63.20) Impression: increasing in size has been biopsied in the past and is cosistent with fibroadenoma Risk of lumpectomy  include bleeding, infection, seroma, more surgery, use of seed/wire, wound care, cosmetic deformity and the need for other treatments, death , blood clots, death. Pt agrees to proceed.  Current Plans Pt Education - [facility] Breast Biopsy HCI: discussed with patient and provided information. Pt Education - [facility] General Post-op HCI FAMILY HISTORY OF BREAST CANCER (Z80.3) Impression: refer to genetics

## 2017-11-14 NOTE — H&P (Signed)
UDELL BLASINGAME 11/14/2017 10:46 AM Location: [facility] Patient #: [id number] DOB: 02/16/1976 Married / Language: [name] / Race: White Female  History of Present Illness [name] A. Raha Tennison MD; 11/14/2017 11:24 AM) Patient words: Patient said at the request of Dr. Milon Dikes for left breast mass. A 1.8 cm mass was discovered in 2017. This her left breast in the lower inner quadrant. Core biopsy showed fibroadenoma. On follow-up this is increased in size. The patient is a strong family history of breast cancer and ovarian cancer. She desires excision. It is not causing any pain. She denies any breast masses, nipple discharge or other problem with either breast.        Diagnosis Breast, left, needle core biopsy, 8 o'clock - FIBROADENOMA. - THERE IS NO EVIDENCE OF MALIGNANCY. - SEE COMMENT. Microscopic Comment [facility]: Left breast 8 o'clock mass, which per prior biopsy results represents a fibroadenoma has increased in size.  EXAM: 2D DIGITAL DIAGNOSTIC LEFT MAMMOGRAM WITH CAD AND ADJUNCT TOMO  ULTRASOUND LEFT BREAST  COMPARISON: Previous exam(s).  ACR Breast Density Category b: There are scattered areas of fibroglandular density.  FINDINGS: Additional mammographic views of the left breast demonstrate interval significant increase in size of the moderately dense macrolobulated mass in the left breast slightly lower inner quadrant, middle depth. Post biopsy marker is located within this mass.  Mammographic images were processed with CAD.  On physical exam, no suspicious masses are palpated.  Targeted ultrasound is performed, showing left breast 8 o'clock 1 cm from the nipple hypoechoic lobulated mass which contains a post biopsy tissue marker. It measures 1.3 x 0.9 x 1.4 cm. The prior sonographic measurements of this mass were 0.9 x 0.6 x 0.8 cm.  IMPRESSION: Interval increase in size of  the left breast 8 o'clock mass.  RECOMMENDATION: Surgical consultation to consider excisional biopsy.  I have discussed the findings and recommendations with the patient. Results were also provided in writing at the conclusion of the visit. If applicable, a reminder letter will be sent to the patient regarding the next appointment.  BI-RADS CATEGORY 4: Suspicious.   Electronically Signed By: Fidela Salisbury M.D.             Diagnosis Breast, left, needle core biopsy, 8 o'clock - FIBROADENOMA. - THERE IS NO EVIDENCE OF MALIGNANCY. - SEE COMMENT. Microscopic Comment The results were called to the [facility].  The patient is a 41 year old female.   Past Surgical History Malachy Moan, [place]; 11/14/2017 10:46 AM) Appendectomy Breast Biopsy Left.  Diagnostic Studies History Malachy Moan, [place]; 11/14/2017 10:46 AM) Colonoscopy 1-5 years ago Mammogram within last year Pap Smear 1-5 years ago  Allergies Malachy Moan, RMA; 11/14/2017 10:47 AM) No Known Allergies 11/14/2017  Medication History Malachy Moan, RMA; 11/14/2017 10:47 AM) Flexeril (10MG  Tablet, Oral) Active. MiraLax (Oral as needed) Active. Metamucil (Oral) Specific strength unknown - Active. Medications Reconciled  Social History Malachy Moan, [place]; 11/14/2017 10:46 AM) Alcohol use Occasional alcohol use. Caffeine use Carbonated beverages, Coffee, Tea. Illicit drug use Remotely quit drug use. Tobacco use Current every day smoker.  Family History Malachy Moan, [place]; 11/14/2017 10:46 AM) Anesthetic complications Mother. Arthritis Father. Breast Cancer Family Members In General, Mother. Cervical Cancer Family Members In General. Depression Mother. Migraine Headache Brother, Mother. Ovarian Cancer Family Members In General. Respiratory Condition Mother.  Pregnancy / Birth History Malachy Moan, [place]; 11/14/2017 10:46 AM) Age  at menarche 48 years.  Contraceptive History Oral contraceptives. Gravida 3 Irregular periods Maternal age 69-25 Para 2  Other Problems Malachy Moan, [place]; 11/14/2017 10:46 AM) Back Pain Migraine Headache Pulmonary Embolism / Blood Clot in Legs     Review of Systems Malachy Moan RMA; 11/14/2017 10:47 AM) General Not Present- Appetite Loss, Chills, Fatigue, Fever, Night Sweats, Weight Gain and Weight Loss. Skin Not Present- Change in Wart/Mole, Dryness, Hives, Jaundice, New Lesions, Non-Healing Wounds, Rash and Ulcer. HEENT Not Present- Earache, Hearing Loss, Hoarseness, Nose Bleed, Oral Ulcers, Ringing in the Ears, Seasonal Allergies, Sinus Pain, Sore Throat, Visual Disturbances, Wears glasses/contact lenses and Yellow Eyes. Respiratory Not Present- Bloody sputum, Chronic Cough, Difficulty Breathing, Snoring and Wheezing. Breast Present- Breast Mass. Not Present- Breast Pain, Nipple Discharge and Skin Changes. Cardiovascular Not Present- Chest Pain, Difficulty Breathing Lying Down, Leg Cramps, Palpitations, Rapid Heart Rate, Shortness of Breath and Swelling of Extremities. Gastrointestinal Not Present- Abdominal Pain, Bloating, Bloody Stool, Change in Bowel Habits, Chronic diarrhea, Constipation, Difficulty Swallowing, Excessive gas, Gets full quickly at meals, Hemorrhoids, Indigestion, Nausea, Rectal Pain and Vomiting. Female Genitourinary Not Present- Frequency, Nocturia, Painful Urination, Pelvic Pain and Urgency. Musculoskeletal Not Present- Back Pain, Joint Pain, Joint Stiffness, Muscle Pain, Muscle Weakness and Swelling of Extremities. Neurological Not Present- Decreased Memory, Fainting, Headaches, Numbness, Seizures, Tingling, Tremor, Trouble walking and Weakness. Psychiatric Not Present- Anxiety, Bipolar, Change in Sleep Pattern, Depression, Fearful and Frequent crying. Endocrine Not Present- Cold Intolerance, Excessive Hunger, Hair Changes, Heat Intolerance, Hot  flashes and New Diabetes. Hematology Not Present- Blood Thinners, Easy Bruising, Excessive bleeding, Gland problems, HIV and Persistent Infections.  Vitals Malachy Moan RMA; 11/14/2017 10:48 AM) 11/14/2017 10:47 AM Weight: 218.8 lb Height: 70in Body Surface Area: 2.17 m Body Mass Index: 31.39 kg/m  Temp.: 98.67F  Pulse: 89 (Regular)  BP: 112/70 (Sitting, Left Arm, Standard)      Physical Exam (Lidwina Kaner A. Reyann Troop MD; 11/14/2017 11:25 AM)  General Mental Status-Alert. General Appearance-Consistent with stated age. Hydration-Well hydrated. Voice-Normal.  Head and Neck Head-normocephalic, atraumatic with no lesions or palpable masses. Trachea-midline. Thyroid Gland Characteristics - normal size and consistency.  Eye Eyeball - Bilateral-Extraocular movements intact. Sclera/Conjunctiva - Bilateral-No scleral icterus.  Chest and Lung Exam Chest and lung exam reveals -quiet, even and easy respiratory effort with no use of accessory muscles and on auscultation, normal breath sounds, no adventitious sounds and normal vocal resonance. Inspection Chest Wall - Normal. Back - normal.  Breast Breast - Left-Symmetric, Non Tender, No Biopsy scars, no Dimpling, No Inflammation, No Lumpectomy scars, No Mastectomy scars, No Peau d' Orange. Breast - Right-Symmetric, Non Tender, No Biopsy scars, no Dimpling, No Inflammation, No Lumpectomy scars, No Mastectomy scars, No Peau d' Orange. Breast Lump-No Palpable Breast Mass.  Cardiovascular Cardiovascular examination reveals -normal heart sounds, regular rate and rhythm with no murmurs and normal pedal pulses bilaterally.  Neurologic Neurologic evaluation reveals -alert and oriented x 3 with no impairment of recent or remote memory. Mental Status-Normal.  Musculoskeletal Normal Exam - Left-Upper Extremity Strength Normal and Lower Extremity Strength Normal. Normal Exam - Right-Upper  Extremity Strength Normal and Lower Extremity Strength Normal.  Lymphatic Head & Neck  General Head & Neck Lymphatics: Bilateral - Description - Normal. Axillary  General Axillary Region: Bilateral - Description - Normal. Tenderness - Non Tender.    Assessment & Plan (Dewanda Fennema A. Omar Orrego MD; 11/14/2017 11:25 AM)  LEFT BREAST MASS (N63.20) Impression: increasing in size has been biopsied in the past and is cosistent with fibroadenoma Risk of lumpectomy  include bleeding, infection, seroma, more surgery, use of seed/wire, wound care, cosmetic deformity and the need for other treatments, death , blood clots, death. Pt agrees to proceed.  Current Plans Pt Education - [facility] Breast Biopsy HCI: discussed with patient and provided information. Pt Education - [facility] General Post-op HCI FAMILY HISTORY OF BREAST CANCER (Z80.3) Impression: refer to genetics

## 2017-11-18 ENCOUNTER — Encounter (HOSPITAL_BASED_OUTPATIENT_CLINIC_OR_DEPARTMENT_OTHER): Payer: Self-pay | Admitting: *Deleted

## 2017-11-18 ENCOUNTER — Other Ambulatory Visit: Payer: Self-pay

## 2017-11-19 ENCOUNTER — Other Ambulatory Visit: Payer: Self-pay

## 2017-11-19 ENCOUNTER — Ambulatory Visit (INDEPENDENT_AMBULATORY_CARE_PROVIDER_SITE_OTHER): Payer: 59 | Admitting: Adult Health

## 2017-11-19 ENCOUNTER — Encounter: Payer: Self-pay | Admitting: Adult Health

## 2017-11-19 VITALS — BP 114/62 | HR 82 | Ht 70.0 in | Wt 219.0 lb

## 2017-11-19 DIAGNOSIS — Z1212 Encounter for screening for malignant neoplasm of rectum: Secondary | ICD-10-CM

## 2017-11-19 DIAGNOSIS — Z1211 Encounter for screening for malignant neoplasm of colon: Secondary | ICD-10-CM | POA: Diagnosis not present

## 2017-11-19 DIAGNOSIS — Z803 Family history of malignant neoplasm of breast: Secondary | ICD-10-CM | POA: Insufficient documentation

## 2017-11-19 DIAGNOSIS — Z01419 Encounter for gynecological examination (general) (routine) without abnormal findings: Secondary | ICD-10-CM | POA: Diagnosis not present

## 2017-11-19 LAB — HEMOCCULT GUIAC POC 1CARD (OFFICE): Fecal Occult Blood, POC: NEGATIVE

## 2017-11-19 NOTE — Progress Notes (Signed)
Patient ID: [name], female   DOB: 02-28-1976, 41 y.o.   MRN: [id number] History of Present Illness: [name] is a 41 year old white female, married, in for a well woman gyn exam,she had a normal pap with negative HPV 09/20/15.She is having fibroadenoma removed next Thursday.She is still working at [employer]. Her son [name] is [name] in high school and has committed to [place] to play baseball.  PCP is Dr Hilma Favors.   Current Medications, Allergies, Past Medical History, Past Surgical History, Family History and Social History were reviewed in [employer] record.     Review of Systems: Patient denies any headaches, hearing loss, fatigue, blurred vision, shortness of breath, chest pain, abdominal pain, problems with bowel movements, urination, or intercourse. No joint pain or mood swings.    Physical Exam:BP 114/62 (BP Location: Right Arm, Patient Position: Sitting, Cuff Size: Large)   Pulse 82   Ht 5\' 10"  (1.778 m)   Wt 219 lb (99.3 kg)   BMI 31.42 kg/m  General:  Well developed, well nourished, no acute distress Skin:  Warm and dry Neck:  Midline trachea, normal thyroid, good ROM, no lymphadenopathy Lungs; Clear to auscultation bilaterally Breast:  No dominant palpable mass, retraction, or nipple discharge Cardiovascular: Regular rate and rhythm Abdomen:  Soft, non tender, no hepatosplenomegaly Pelvic:  External genitalia is normal in appearance, no lesions.  The vagina is normal in appearance. Urethra has no lesions or masses. The cervix is bulbous.  Uterus is felt to be normal size, shape, and contour.  No adnexal masses or tenderness noted.Bladder is non tender, no masses felt. Rectal: Good sphincter tone, no polyps, or hemorrhoids felt.  Hemoccult negative. Extremities/musculoskeletal:  No swelling or varicosities noted, no clubbing or cyanosis Psych:  No mood changes, alert and cooperative,seems happy PHQ 2 score o  Impression:  1. Well woman exam with  routine gynecological exam   2. Screening for colorectal cancer   3. Family history of breast cancer in first degree relative      Plan:  Pap and physical in 1 year Mammogram yearly, may get breast MRI [place] when wants fasting labs

## 2017-11-24 NOTE — Progress Notes (Signed)
Ensure pre surgery drink given with instructions to complete by 0815 dos, pt verbalized understanding. 

## 2017-11-25 ENCOUNTER — Ambulatory Visit
Admission: RE | Admit: 2017-11-25 | Discharge: 2017-11-25 | Disposition: A | Discharge status: Home / Self Care | Payer: 59 | Source: Ambulatory Visit | Attending: Surgery | Admitting: Surgery

## 2017-11-25 DIAGNOSIS — N6320 Unspecified lump in the left breast, unspecified quadrant: Secondary | ICD-10-CM

## 2017-11-25 DIAGNOSIS — R928 Other abnormal and inconclusive findings on diagnostic imaging of breast: Secondary | ICD-10-CM | POA: Diagnosis not present

## 2017-11-27 ENCOUNTER — Encounter (HOSPITAL_BASED_OUTPATIENT_CLINIC_OR_DEPARTMENT_OTHER): Payer: Self-pay

## 2017-11-27 ENCOUNTER — Ambulatory Visit (HOSPITAL_BASED_OUTPATIENT_CLINIC_OR_DEPARTMENT_OTHER): Payer: 59 | Admitting: Anesthesiology

## 2017-11-27 ENCOUNTER — Other Ambulatory Visit: Payer: Self-pay

## 2017-11-27 ENCOUNTER — Ambulatory Visit
Admission: RE | Admit: 2017-11-27 | Discharge: 2017-11-27 | Disposition: A | Discharge status: Home / Self Care | Payer: 59 | Source: Ambulatory Visit | Attending: Surgery | Admitting: Surgery

## 2017-11-27 ENCOUNTER — Encounter (HOSPITAL_BASED_OUTPATIENT_CLINIC_OR_DEPARTMENT_OTHER)
Admission: RE | Disposition: A | Discharge status: Home / Self Care | Payer: Self-pay | Source: Ambulatory Visit | Attending: Surgery

## 2017-11-27 ENCOUNTER — Ambulatory Visit (HOSPITAL_BASED_OUTPATIENT_CLINIC_OR_DEPARTMENT_OTHER)
Admission: RE | Admit: 2017-11-27 | Discharge: 2017-11-27 | Disposition: A | Discharge status: Home / Self Care | Payer: 59 | Source: Ambulatory Visit | Attending: Surgery | Admitting: Surgery

## 2017-11-27 DIAGNOSIS — N920 Excessive and frequent menstruation with regular cycle: Secondary | ICD-10-CM | POA: Diagnosis not present

## 2017-11-27 DIAGNOSIS — N6320 Unspecified lump in the left breast, unspecified quadrant: Secondary | ICD-10-CM | POA: Diagnosis present

## 2017-11-27 DIAGNOSIS — Z8041 Family history of malignant neoplasm of ovary: Secondary | ICD-10-CM | POA: Diagnosis not present

## 2017-11-27 DIAGNOSIS — F17200 Nicotine dependence, unspecified, uncomplicated: Secondary | ICD-10-CM | POA: Insufficient documentation

## 2017-11-27 DIAGNOSIS — D242 Benign neoplasm of left breast: Secondary | ICD-10-CM | POA: Insufficient documentation

## 2017-11-27 DIAGNOSIS — E669 Obesity, unspecified: Secondary | ICD-10-CM | POA: Insufficient documentation

## 2017-11-27 DIAGNOSIS — Z803 Family history of malignant neoplasm of breast: Secondary | ICD-10-CM | POA: Diagnosis not present

## 2017-11-27 DIAGNOSIS — Z6831 Body mass index (BMI) 31.0-31.9, adult: Secondary | ICD-10-CM | POA: Insufficient documentation

## 2017-11-27 DIAGNOSIS — Z86718 Personal history of other venous thrombosis and embolism: Secondary | ICD-10-CM | POA: Diagnosis not present

## 2017-11-27 DIAGNOSIS — M549 Dorsalgia, unspecified: Secondary | ICD-10-CM | POA: Diagnosis not present

## 2017-11-27 DIAGNOSIS — Z86711 Personal history of pulmonary embolism: Secondary | ICD-10-CM | POA: Insufficient documentation

## 2017-11-27 HISTORY — PX: BREAST LUMPECTOMY WITH RADIOACTIVE SEED LOCALIZATION: SHX6424

## 2017-11-27 SURGERY — BREAST LUMPECTOMY WITH RADIOACTIVE SEED LOCALIZATION
Anesthesia: General | Site: Breast | Laterality: Left

## 2017-11-27 MED ORDER — BUPIVACAINE-EPINEPHRINE (PF) 0.25% -1:200000 IJ SOLN
INTRAMUSCULAR | Status: DC | PRN
  Administered 2017-11-27: 10 mL

## 2017-11-27 MED ORDER — KETOROLAC TROMETHAMINE 30 MG/ML IJ SOLN
INTRAMUSCULAR | Status: AC
  Filled 2017-11-27: qty 1

## 2017-11-27 MED ORDER — PROPOFOL 10 MG/ML IV BOLUS
INTRAVENOUS | Status: DC | PRN
  Administered 2017-11-27: 200 mg via INTRAVENOUS

## 2017-11-27 MED ORDER — ONDANSETRON HCL 4 MG/2ML IJ SOLN
INTRAMUSCULAR | Status: DC | PRN
  Administered 2017-11-27: 4 mg via INTRAVENOUS

## 2017-11-27 MED ORDER — DEXAMETHASONE SODIUM PHOSPHATE 10 MG/ML IJ SOLN
INTRAMUSCULAR | Status: AC
  Filled 2017-11-27: qty 1

## 2017-11-27 MED ORDER — FENTANYL CITRATE (PF) 100 MCG/2ML IJ SOLN
INTRAMUSCULAR | Status: AC
  Filled 2017-11-27: qty 2

## 2017-11-27 MED ORDER — LIDOCAINE 2% (20 MG/ML) 5 ML SYRINGE
INTRAMUSCULAR | Status: DC | PRN
  Administered 2017-11-27: 40 mg via INTRAVENOUS

## 2017-11-27 MED ORDER — FENTANYL CITRATE (PF) 100 MCG/2ML IJ SOLN
50.0000 ug | INTRAMUSCULAR | Status: DC | PRN
  Administered 2017-11-27: 100 ug via INTRAVENOUS

## 2017-11-27 MED ORDER — LIDOCAINE 2% (20 MG/ML) 5 ML SYRINGE
INTRAMUSCULAR | Status: AC
  Filled 2017-11-27: qty 5

## 2017-11-27 MED ORDER — CHLORHEXIDINE GLUCONATE CLOTH 2 % EX PADS: 6.0000 | MEDICATED_PAD | Freq: Once | CUTANEOUS | Status: DC

## 2017-11-27 MED ORDER — LACTATED RINGERS IV SOLN
INTRAVENOUS | Status: DC
  Administered 2017-11-27 (×2): via INTRAVENOUS

## 2017-11-27 MED ORDER — CEFAZOLIN SODIUM-DEXTROSE 2-4 GM/100ML-% IV SOLN
INTRAVENOUS | Status: AC
  Filled 2017-11-27: qty 100

## 2017-11-27 MED ORDER — ONDANSETRON HCL 4 MG/2ML IJ SOLN
INTRAMUSCULAR | Status: AC
  Filled 2017-11-27: qty 2

## 2017-11-27 MED ORDER — KETOROLAC TROMETHAMINE 30 MG/ML IJ SOLN
INTRAMUSCULAR | Status: DC | PRN
  Administered 2017-11-27: 30 mg via INTRAVENOUS

## 2017-11-27 MED ORDER — MIDAZOLAM HCL 2 MG/2ML IJ SOLN
1.0000 mg | INTRAMUSCULAR | Status: DC | PRN
  Administered 2017-11-27: 2 mg via INTRAVENOUS

## 2017-11-27 MED ORDER — OXYCODONE HCL 5 MG PO TABS: 5.0000 mg | ORAL_TABLET | Freq: Four times a day (QID) | ORAL | 0 refills | Status: DC | PRN

## 2017-11-27 MED ORDER — MIDAZOLAM HCL 2 MG/2ML IJ SOLN
INTRAMUSCULAR | Status: AC
  Filled 2017-11-27: qty 2

## 2017-11-27 MED ORDER — IBUPROFEN 800 MG PO TABS: 800.0000 mg | ORAL_TABLET | Freq: Three times a day (TID) | ORAL | 0 refills | Status: DC | PRN

## 2017-11-27 MED ORDER — DEXAMETHASONE SODIUM PHOSPHATE 4 MG/ML IJ SOLN
INTRAMUSCULAR | Status: DC | PRN
  Administered 2017-11-27: 10 mg via INTRAVENOUS

## 2017-11-27 MED ORDER — SCOPOLAMINE 1 MG/3DAYS TD PT72: 1.0000 | MEDICATED_PATCH | Freq: Once | TRANSDERMAL | Status: DC | PRN

## 2017-11-27 MED ORDER — PROPOFOL 500 MG/50ML IV EMUL
INTRAVENOUS | Status: AC
  Filled 2017-11-27: qty 50

## 2017-11-27 MED ORDER — FENTANYL CITRATE (PF) 100 MCG/2ML IJ SOLN: 25.0000 ug | INTRAMUSCULAR | Status: DC | PRN

## 2017-11-27 MED ORDER — DEXTROSE 5 % IV SOLN
3.0000 g | INTRAVENOUS | Status: AC
  Administered 2017-11-27: 2 g via INTRAVENOUS

## 2017-11-27 MED ORDER — PROMETHAZINE HCL 25 MG/ML IJ SOLN: 6.2500 mg | INTRAMUSCULAR | Status: DC | PRN

## 2017-11-27 SURGICAL SUPPLY — 57 items
ADH SKN CLS APL DERMABOND .7 (GAUZE/BANDAGES/DRESSINGS) ×1
APPLIER CLIP 9.375 MED OPEN (MISCELLANEOUS)
APR CLP MED 9.3 20 MLT OPN (MISCELLANEOUS)
BINDER BREAST LRG (GAUZE/BANDAGES/DRESSINGS) IMPLANT
BINDER BREAST MEDIUM (GAUZE/BANDAGES/DRESSINGS) IMPLANT
BINDER BREAST XLRG (GAUZE/BANDAGES/DRESSINGS) ×2 IMPLANT
BINDER BREAST XXLRG (GAUZE/BANDAGES/DRESSINGS) IMPLANT
BLADE SURG 15 STRL LF DISP TIS (BLADE) ×1 IMPLANT
BLADE SURG 15 STRL SS (BLADE) ×3
CANISTER SUC SOCK COL 7IN (MISCELLANEOUS) IMPLANT
CANISTER SUCT 1200ML W/VALVE (MISCELLANEOUS) IMPLANT
CHLORAPREP W/TINT 26ML (MISCELLANEOUS) ×3 IMPLANT
CLIP APPLIE 9.375 MED OPEN (MISCELLANEOUS) IMPLANT
COVER BACK TABLE 60X90IN (DRAPES) ×3 IMPLANT
COVER MAYO STAND STRL (DRAPES) ×3 IMPLANT
COVER PROBE W GEL 5X96 (DRAPES) ×3 IMPLANT
DECANTER SPIKE VIAL GLASS SM (MISCELLANEOUS) IMPLANT
DERMABOND ADVANCED (GAUZE/BANDAGES/DRESSINGS) ×2
DERMABOND ADVANCED .7 DNX12 (GAUZE/BANDAGES/DRESSINGS) ×1 IMPLANT
DEVICE DUBIN W/COMP PLATE 8390 (MISCELLANEOUS) ×3 IMPLANT
DRAPE LAPAROSCOPIC ABDOMINAL (DRAPES) IMPLANT
DRAPE LAPAROTOMY 100X72 PEDS (DRAPES) ×3 IMPLANT
DRAPE UTILITY XL STRL (DRAPES) ×3 IMPLANT
ELECT COATED BLADE 2.86 ST (ELECTRODE) ×3 IMPLANT
ELECT REM PT RETURN 9FT ADLT (ELECTROSURGICAL) ×3
ELECTRODE REM PT RTRN 9FT ADLT (ELECTROSURGICAL) ×1 IMPLANT
GLOVE BIO SURGEON STRL SZ 6.5 (GLOVE) ×1 IMPLANT
GLOVE BIO SURGEON STRL SZ7 (GLOVE) ×2 IMPLANT
GLOVE BIO SURGEONS STRL SZ 6.5 (GLOVE) ×1
GLOVE BIOGEL PI IND STRL 7.0 (GLOVE) IMPLANT
GLOVE BIOGEL PI IND STRL 8 (GLOVE) ×1 IMPLANT
GLOVE BIOGEL PI INDICATOR 7.0 (GLOVE) ×2
GLOVE BIOGEL PI INDICATOR 8 (GLOVE) ×2
GLOVE ECLIPSE 8.0 STRL XLNG CF (GLOVE) ×3 IMPLANT
GOWN STRL REUS W/ TWL LRG LVL3 (GOWN DISPOSABLE) ×2 IMPLANT
GOWN STRL REUS W/ TWL XL LVL3 (GOWN DISPOSABLE) IMPLANT
GOWN STRL REUS W/TWL LRG LVL3 (GOWN DISPOSABLE) ×6
GOWN STRL REUS W/TWL XL LVL3 (GOWN DISPOSABLE) ×3
HEMOSTAT ARISTA ABSORB 3G PWDR (MISCELLANEOUS) IMPLANT
HEMOSTAT SNOW SURGICEL 2X4 (HEMOSTASIS) IMPLANT
KIT MARKER MARGIN INK (KITS) ×3 IMPLANT
NDL HYPO 25X1 1.5 SAFETY (NEEDLE) ×1 IMPLANT
NEEDLE HYPO 25X1 1.5 SAFETY (NEEDLE) ×3 IMPLANT
NS IRRIG 1000ML POUR BTL (IV SOLUTION) ×3 IMPLANT
PACK BASIN DAY SURGERY FS (CUSTOM PROCEDURE TRAY) ×3 IMPLANT
PENCIL BUTTON HOLSTER BLD 10FT (ELECTRODE) ×3 IMPLANT
SLEEVE SCD COMPRESS KNEE MED (MISCELLANEOUS) ×3 IMPLANT
SPONGE LAP 4X18 X RAY DECT (DISPOSABLE) ×3 IMPLANT
SUT MNCRL AB 4-0 PS2 18 (SUTURE) ×3 IMPLANT
SUT SILK 2 0 SH (SUTURE) IMPLANT
SUT VICRYL 3-0 CR8 SH (SUTURE) ×3 IMPLANT
SYR CONTROL 10ML LL (SYRINGE) ×3 IMPLANT
TOWEL OR 17X24 6PK STRL BLUE (TOWEL DISPOSABLE) ×3 IMPLANT
TOWEL OR NON WOVEN STRL DISP B (DISPOSABLE) ×3 IMPLANT
TUBE CONNECTING 20'X1/4 (TUBING)
TUBE CONNECTING 20X1/4 (TUBING) IMPLANT
YANKAUER SUCT BULB TIP NO VENT (SUCTIONS) IMPLANT

## 2017-11-27 NOTE — Anesthesia Preprocedure Evaluation (Addendum)
Anesthesia Evaluation  Patient identified by MRN, date of birth, ID band Patient awake    Reviewed: Allergy & Precautions, NPO status , Patient's Chart, lab work & pertinent test results  Airway Mallampati: II  TM Distance: >3 FB Neck ROM: Full    Dental  (+) Teeth Intact, Dental Advisory Given, Caps,    Pulmonary Current Smoker, PE   Pulmonary exam normal breath sounds clear to auscultation       Cardiovascular negative cardio ROS Normal cardiovascular exam Rhythm:Regular Rate:Normal     Neuro/Psych negative neurological ROS  negative psych ROS   GI/Hepatic negative GI ROS, Neg liver ROS,   Endo/Other  Obesity   Renal/GU negative Renal ROS     Musculoskeletal negative musculoskeletal ROS (+)   Abdominal   Peds  Hematology negative hematology ROS (+)   Anesthesia Other Findings Day of surgery medications reviewed with the patient.  Left breast mass  Reproductive/Obstetrics negative OB ROS                            Anesthesia Physical Anesthesia Plan  ASA: II  Anesthesia Plan: General   Post-op Pain Management:    Induction: Intravenous  PONV Risk Score and Plan: 2 and Dexamethasone, Ondansetron and Midazolam  Airway Management Planned: LMA  Additional Equipment:   Intra-op Plan:   Post-operative Plan: Extubation in OR  Informed Consent: I have reviewed the patients History and Physical, chart, labs and discussed the procedure including the risks, benefits and alternatives for the proposed anesthesia with the patient or authorized representative who has indicated his/her understanding and acceptance.   Dental advisory given  Plan Discussed with: CRNA  Anesthesia Plan Comments: (Risks/benefits of general anesthesia discussed with patient including risk of damage to teeth, lips, gum, and tongue, nausea/vomiting, allergic reactions to medications, and the possibility of  heart attack, stroke and death.  All patient questions answered.  Patient wishes to proceed.)        Anesthesia Quick Evaluation

## 2017-11-27 NOTE — Anesthesia Postprocedure Evaluation (Signed)
Anesthesia Post Note  Patient: Tyson Babinski  Procedure(s) Performed: LEFT BREAST LUMPECTOMY WITH RADIOACTIVE SEED LOCALIZATION ERAS PATHWAY (Left Breast)     Patient location during evaluation: PACU Anesthesia Type: General Level of consciousness: awake and alert, oriented and awake Pain management: pain level controlled Vital Signs Assessment: post-procedure vital signs reviewed and stable Respiratory status: spontaneous breathing, nonlabored ventilation, respiratory function stable and patient connected to nasal cannula oxygen Cardiovascular status: blood pressure returned to baseline and stable Postop Assessment: no apparent nausea or vomiting Anesthetic complications: no    Last Vitals:  Vitals:   11/27/17 1245 11/27/17 1300  BP: 107/70 110/73  Pulse: (!) 58 66  Resp: 18 16  Temp:  36.5 C  SpO2: 99% 93%    Last Pain:  Vitals:   11/27/17 1300  TempSrc:   PainSc: 0-No pain                 Catalina Gravel

## 2017-11-27 NOTE — Op Note (Signed)
Preoperative diagnosis: Left breast mass  Postoperative diagnosis: Same  Procedure: Left breast seed localized partial mastectomy  Surgeon: Erroll Luna MD  Anesthesia: LMA with local  EBL: 30 cc  Specimen: Left breast mass with seed and clip in specimen  Drains: None  IV fluids: Per anesthesia record  Indications for procedure: The patient presents secondary to a left breast mass.  This was detected on mammography.  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  Risk, benefits and other options were discussed with the patient.The procedure has been discussed with the patient. Alternatives to surgery have been discussed with the patient.  Risks of surgery include bleeding,  Infection,  Seroma formation, cosmetic deformity and death,  and the need for further surgery.   The patient understands and wishes to proceed.    Description of procedure: Patient presents to the holding area.  She was seen in the left breast was marked as correct side.  The procedure was discussed and she wished to proceed.  She underwent seed placement as outpatient.  She was taken back to the operating room and placed supine on the OR table.  After induction of LMA anesthesia, left breast was prepped and draped in sterile fashion.  Timeout was done.  Neoprobe was used to identify the seed at the 8 o'clock position.  Curvilinear incision made along the medial border of the nipple areolar complex.  Dissection carried down all tissue around the seed and clip were excised with a grossly negative margin.  Radiograph revealed the seed and clip to be present and this was sent to pathology.  Wound was irrigated and closed with 3-0 Vicryl and 4-0 Monocryl.  Dermabond applied.  All final counts found to be correct.  The patient was awoke extubated taken to recovery in satisfactory condition.

## 2017-11-27 NOTE — Transfer of Care (Signed)
Immediate Anesthesia Transfer of Care Note  Patient: [name]  Procedure(s) Performed: LEFT BREAST LUMPECTOMY WITH RADIOACTIVE SEED LOCALIZATION ERAS PATHWAY (Left Breast)  Patient Location: PACU  Anesthesia Type:General  Level of Consciousness: awake, alert , oriented and patient cooperative  Airway & Oxygen Therapy: Patient Spontanous Breathing and Patient connected to face mask oxygen  Post-op Assessment: Report given to RN and Post -op Vital signs reviewed and stable  Post vital signs: Reviewed and stable  Last Vitals:  Vitals:   11/27/17 1006  BP: 114/61  Pulse: 69  Resp: 18  Temp: 36.8 C  SpO2: 100%    Last Pain:  Vitals:   11/27/17 1006  TempSrc: Oral         Complications: No apparent anesthesia complications

## 2017-11-27 NOTE — Interval H&P Note (Signed)
History and Physical Interval Note:  11/27/2017 11:16 AM  [name]  has presented today for surgery, with the diagnosis of Left breast mass  The various methods of treatment have been discussed with the patient and family. After consideration of risks, benefits and other options for treatment, the patient has consented to  Procedure(s): LEFT BREAST LUMPECTOMY WITH RADIOACTIVE SEED LOCALIZATION ERAS PATHWAY (Left) as a surgical intervention .  The patient's history has been reviewed, patient examined, no change in status, stable for surgery.  I have reviewed the patient's chart and labs.  Questions were answered to the patient's satisfaction.     Clark

## 2017-11-27 NOTE — Discharge Instructions (Signed)
[facility] °Office Phone Number [phone] ° °BREAST BIOPSY/ PARTIAL MASTECTOMY: POST OP INSTRUCTIONS ° °Always review your discharge instruction sheet given to you by the facility where your surgery was performed. ° °IF YOU HAVE DISABILITY OR FAMILY LEAVE FORMS, YOU MUST BRING THEM TO THE OFFICE FOR PROCESSING.  DO NOT GIVE THEM TO YOUR DOCTOR. ° °1. A prescription for pain medication may be given to you upon discharge.  Take your pain medication as prescribed, if needed.  If narcotic pain medicine is not needed, then you may take acetaminophen (Tylenol) or ibuprofen (Advil) as needed. °2. Take your usually prescribed medications unless otherwise directed °3. If you need a refill on your pain medication, please contact your pharmacy.  They will contact our office to request authorization.  Prescriptions will not be filled after 5pm or on week-ends. °4. You should eat very light the first 24 hours after surgery, such as soup, crackers, pudding, etc.  Resume your normal diet the day after surgery. °5. Most patients will experience some swelling and bruising in the breast.  Ice packs and a good support bra will help.  Swelling and bruising can take several days to resolve.  °6. It is common to experience some constipation if taking pain medication after surgery.  Increasing fluid intake and taking a stool softener will usually help or prevent this problem from occurring.  A mild laxative (Milk of Magnesia or Miralax) should be taken according to package directions if there are no bowel movements after 48 hours. °7. Unless discharge instructions indicate otherwise, you may remove your bandages 24-48 hours after surgery, and you may shower at that time.  You may have steri-strips (small skin tapes) in place directly over the incision.  These strips should be left on the skin for 7-10 days.  If your surgeon used skin glue on the incision, you may shower in 24 hours.  The glue will flake off over the  next 2-3 weeks.  Any sutures or staples will be removed at the office during your follow-up visit. °8. ACTIVITIES:  You may resume regular daily activities (gradually increasing) beginning the next day.  Wearing a good support bra or sports bra minimizes pain and swelling.  You may have sexual intercourse when it is comfortable. °a. You may drive when you no longer are taking prescription pain medication, you can comfortably wear a seatbelt, and you can safely maneuver your car and apply brakes. °b. RETURN TO WORK:  ______________________________________________________________________________________ °9. You should see your doctor in the office for a follow-up appointment approximately two weeks after your surgery.  Your doctor’s nurse will typically make your follow-up appointment when she calls you with your pathology report.  Expect your pathology report 2-3 business days after your surgery.  You may call to check if you do not hear from us after three days. °10. OTHER INSTRUCTIONS: _______________________________________________________________________________________________ _____________________________________________________________________________________________________________________________________ °_____________________________________________________________________________________________________________________________________ °_____________________________________________________________________________________________________________________________________ ° °WHEN TO CALL YOUR DOCTOR: °1. Fever over 101.0 °2. Nausea and/or vomiting. °3. Extreme swelling or bruising. °4. Continued bleeding from incision. °5. Increased pain, redness, or drainage from the incision. ° °The clinic staff is available to answer your questions during regular business hours.  Please don’t hesitate to call and ask to speak to one of the nurses for clinical concerns.  If you have a medical emergency, go to the nearest  emergency room or call 911.  A surgeon from [facility] is always on call at the hospital. ° °For further questions, please visit [email or web address]  °

## 2017-11-27 NOTE — Anesthesia Procedure Notes (Signed)
Procedure Name: LMA Insertion Performed by: Zariana Strub W, CRNA Pre-anesthesia Checklist: Patient identified, Emergency Drugs available, Suction available and Patient being monitored Patient Re-evaluated:Patient Re-evaluated prior to induction Oxygen Delivery Method: Circle system utilized Preoxygenation: Pre-oxygenation with 100% oxygen Induction Type: IV induction Ventilation: Mask ventilation without difficulty LMA: LMA inserted LMA Size: 4.0 Number of attempts: 1 Placement Confirmation: positive ETCO2 Tube secured with: Tape Dental Injury: Teeth and Oropharynx as per pre-operative assessment        

## 2017-11-28 ENCOUNTER — Encounter (HOSPITAL_BASED_OUTPATIENT_CLINIC_OR_DEPARTMENT_OTHER): Payer: Self-pay | Admitting: Surgery

## 2017-12-12 ENCOUNTER — Telehealth: Payer: Self-pay | Admitting: Genetics

## 2017-12-12 NOTE — Telephone Encounter (Signed)
12/12/17 @ 12:11 pm called and left message with patient to return call to confirm appointment with Ferol Luz for 01/22/18 @ 2:00 pm

## 2017-12-18 ENCOUNTER — Encounter: Payer: Self-pay | Admitting: Adult Health

## 2018-01-22 ENCOUNTER — Other Ambulatory Visit: Payer: 59

## 2018-01-22 ENCOUNTER — Encounter: Payer: 59 | Admitting: Genetics

## 2018-02-18 DIAGNOSIS — S338XXA Sprain of other parts of lumbar spine and pelvis, initial encounter: Secondary | ICD-10-CM | POA: Diagnosis not present

## 2018-02-18 DIAGNOSIS — S233XXA Sprain of ligaments of thoracic spine, initial encounter: Secondary | ICD-10-CM | POA: Diagnosis not present

## 2018-02-18 DIAGNOSIS — S134XXA Sprain of ligaments of cervical spine, initial encounter: Secondary | ICD-10-CM | POA: Diagnosis not present

## 2018-02-20 DIAGNOSIS — S233XXA Sprain of ligaments of thoracic spine, initial encounter: Secondary | ICD-10-CM | POA: Diagnosis not present

## 2018-02-20 DIAGNOSIS — S134XXA Sprain of ligaments of cervical spine, initial encounter: Secondary | ICD-10-CM | POA: Diagnosis not present

## 2018-02-20 DIAGNOSIS — S338XXA Sprain of other parts of lumbar spine and pelvis, initial encounter: Secondary | ICD-10-CM | POA: Diagnosis not present

## 2018-03-03 DIAGNOSIS — S233XXA Sprain of ligaments of thoracic spine, initial encounter: Secondary | ICD-10-CM | POA: Diagnosis not present

## 2018-03-03 DIAGNOSIS — S338XXA Sprain of other parts of lumbar spine and pelvis, initial encounter: Secondary | ICD-10-CM | POA: Diagnosis not present

## 2018-03-03 DIAGNOSIS — S134XXA Sprain of ligaments of cervical spine, initial encounter: Secondary | ICD-10-CM | POA: Diagnosis not present

## 2018-12-03 ENCOUNTER — Other Ambulatory Visit (HOSPITAL_COMMUNITY)
Admission: RE | Admit: 2018-12-03 | Discharge: 2018-12-03 | Disposition: A | Discharge status: Home / Self Care | Payer: 59 | Source: Ambulatory Visit | Attending: Adult Health | Admitting: Adult Health

## 2018-12-03 ENCOUNTER — Ambulatory Visit: Payer: 59 | Admitting: Adult Health

## 2018-12-03 ENCOUNTER — Encounter: Payer: Self-pay | Admitting: Adult Health

## 2018-12-03 VITALS — BP 126/76 | HR 74 | Ht 70.0 in | Wt 219.0 lb

## 2018-12-03 DIAGNOSIS — Z1212 Encounter for screening for malignant neoplasm of rectum: Secondary | ICD-10-CM

## 2018-12-03 DIAGNOSIS — Z01419 Encounter for gynecological examination (general) (routine) without abnormal findings: Secondary | ICD-10-CM | POA: Diagnosis present

## 2018-12-03 DIAGNOSIS — Z1211 Encounter for screening for malignant neoplasm of colon: Secondary | ICD-10-CM | POA: Diagnosis not present

## 2018-12-03 LAB — HEMOCCULT GUIAC POC 1CARD (OFFICE): Fecal Occult Blood, POC: NEGATIVE

## 2018-12-03 NOTE — Progress Notes (Signed)
Patient ID: [name], female   DOB: [birth year], 42 y.o.   MRN: [id number] History of Present Illness: [name] is a 42 year old white female, married, G3P2, in for well woman gyn exam and pap. PCP is Dr Hilma Favors.   Current Medications, Allergies, Past Medical History, Past Surgical History, Family History and Social History were reviewed in [employer] record.     Review of Systems:  Patient denies any headaches, hearing loss, fatigue, blurred vision, shortness of breath, chest pain, abdominal pain, problems with bowel movements, urination, or intercourse. No joint pain or mood swings. Occasional breast tenderness on the left.  Physical Exam:BP 126/76 (BP Location: Left Arm, Patient Position: Sitting, Cuff Size: Normal)   Pulse 74   Ht 5\' 10"  (1.778 m)   Wt 219 lb (99.3 kg)   BMI 31.42 kg/m  General:  Well developed, well nourished, no acute distress Skin:  Warm and dry Neck:  Midline trachea, normal thyroid, good ROM, no lymphadenopathy Lungs; Clear to auscultation bilaterally Breast:  No dominant palpable mass, retraction, or nipple discharge,well healed scar on left breast  Cardiovascular: Regular rate and rhythm Abdomen:  Soft, non tender, no hepatosplenomegaly Pelvic:  External genitalia is normal in appearance, no lesions.  The vagina is normal in appearance. Urethra has no lesions or masses. The cervix is bulbous. Pap with HPV performed. Uterus is felt to be normal size, shape, and contour.  No adnexal masses or tenderness noted.Bladder is non tender, no masses felt. Rectal: Good sphincter tone, no polyps, or hemorrhoids felt.  Hemoccult negative. Extremities/musculoskeletal:  No swelling or varicosities noted, no clubbing or cyanosis Psych:  No mood changes, alert and cooperative,seems happy PHQ 2 score 0. Fall risk is low. Examination chaperoned by Diona Fanti CMA.  Impression: 1. Encounter for gynecological examination with Papanicolaou smear of  cervix   2. Screening for colorectal cancer       Plan: Physical in 1 year  Pap in 3 if normal Get mammogram  Labs fasting in near future, will call when ready

## 2018-12-07 LAB — CYTOLOGY - PAP
Diagnosis: NEGATIVE
HPV: NOT DETECTED

## 2018-12-24 DIAGNOSIS — Z0000 Encounter for general adult medical examination without abnormal findings: Secondary | ICD-10-CM | POA: Diagnosis not present

## 2018-12-24 DIAGNOSIS — I2699 Other pulmonary embolism without acute cor pulmonale: Secondary | ICD-10-CM | POA: Diagnosis not present

## 2018-12-24 DIAGNOSIS — E6609 Other obesity due to excess calories: Secondary | ICD-10-CM | POA: Diagnosis not present

## 2018-12-24 DIAGNOSIS — G43109 Migraine with aura, not intractable, without status migrainosus: Secondary | ICD-10-CM | POA: Diagnosis not present

## 2018-12-24 DIAGNOSIS — Z1389 Encounter for screening for other disorder: Secondary | ICD-10-CM | POA: Diagnosis not present

## 2019-01-06 ENCOUNTER — Other Ambulatory Visit: Payer: Self-pay | Admitting: Maternal & Fetal Medicine

## 2019-01-06 ENCOUNTER — Ambulatory Visit
Admission: RE | Admit: 2019-01-06 | Discharge: 2019-01-06 | Disposition: A | Discharge status: Home / Self Care | Payer: 59 | Source: Ambulatory Visit

## 2019-01-06 ENCOUNTER — Other Ambulatory Visit: Payer: Self-pay | Admitting: Obstetrics and Gynecology

## 2019-01-06 DIAGNOSIS — Z1231 Encounter for screening mammogram for malignant neoplasm of breast: Secondary | ICD-10-CM | POA: Diagnosis not present

## 2019-01-08 ENCOUNTER — Other Ambulatory Visit: Payer: Self-pay

## 2019-01-08 DIAGNOSIS — Z1231 Encounter for screening mammogram for malignant neoplasm of breast: Secondary | ICD-10-CM

## 2019-04-02 IMAGING — US ULTRASOUND LEFT BREAST LIMITED
1 series · 8 of 8 positions shown · non-contrast
Comparison: Previous exam(s).

CLINICAL DATA: Left breast 8 o'clock mass, which per prior biopsy
results represents a fibroadenoma has increased in size.

EXAM:
2D DIGITAL DIAGNOSTIC LEFT MAMMOGRAM WITH CAD AND ADJUNCT TOMO
ULTRASOUND LEFT BREAST

[Series 1: ultrasound left breast limited · 0.07mm/px · 8 of 8 slices shown]
[im 1/8]
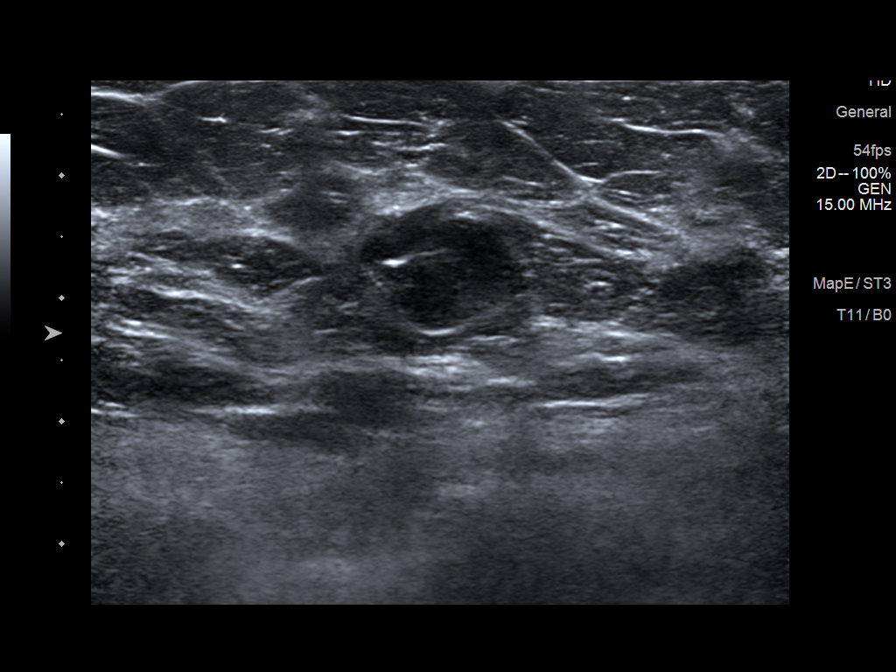
[im 2/8]
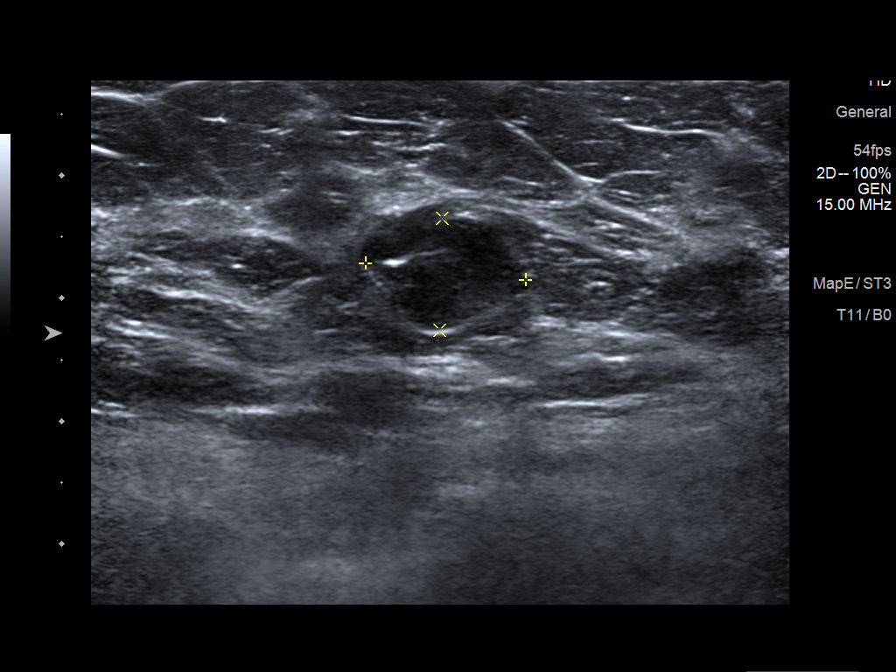
[im 3/8]
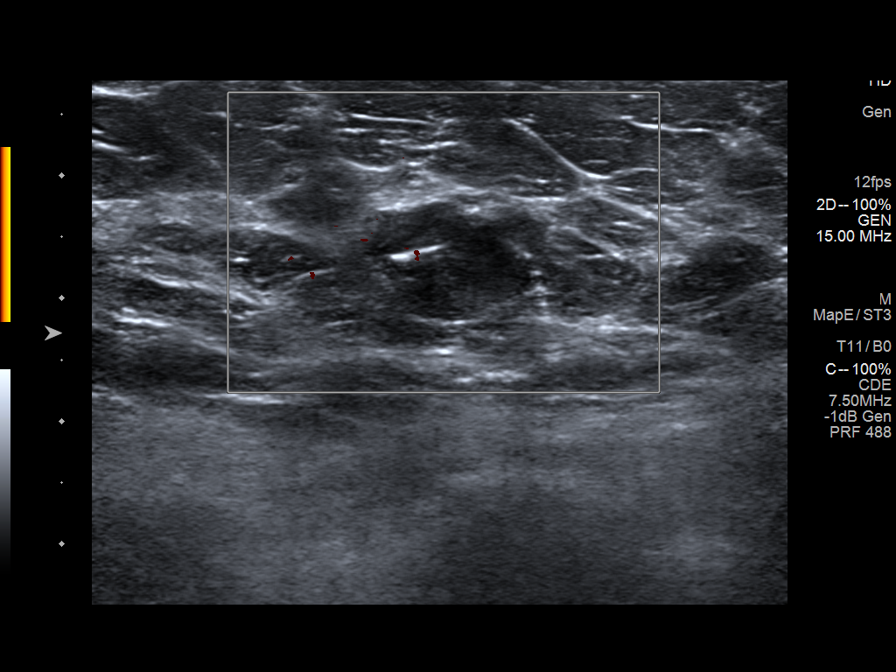
[im 4/8]
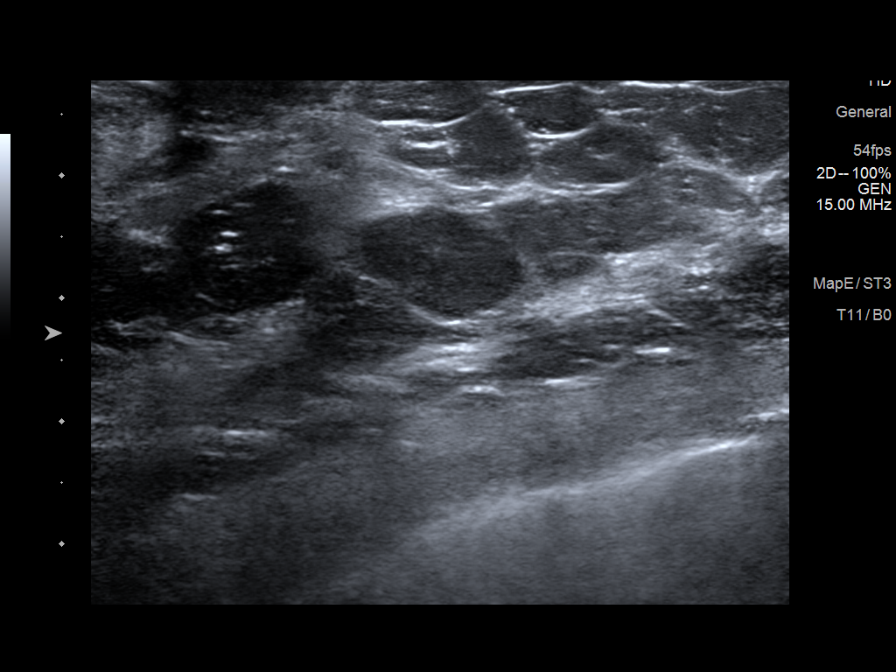
[im 5/8]
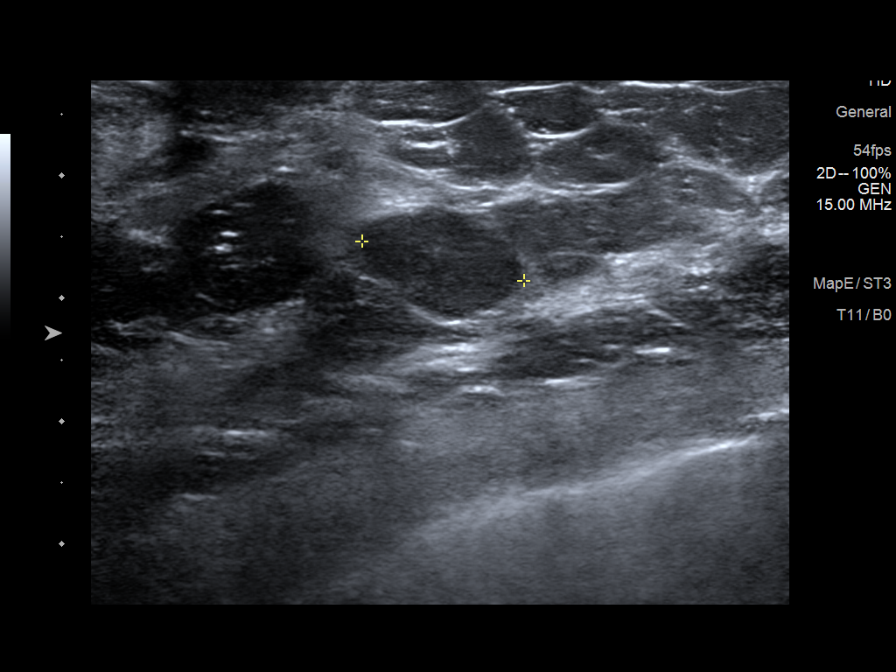
[im 6/8]
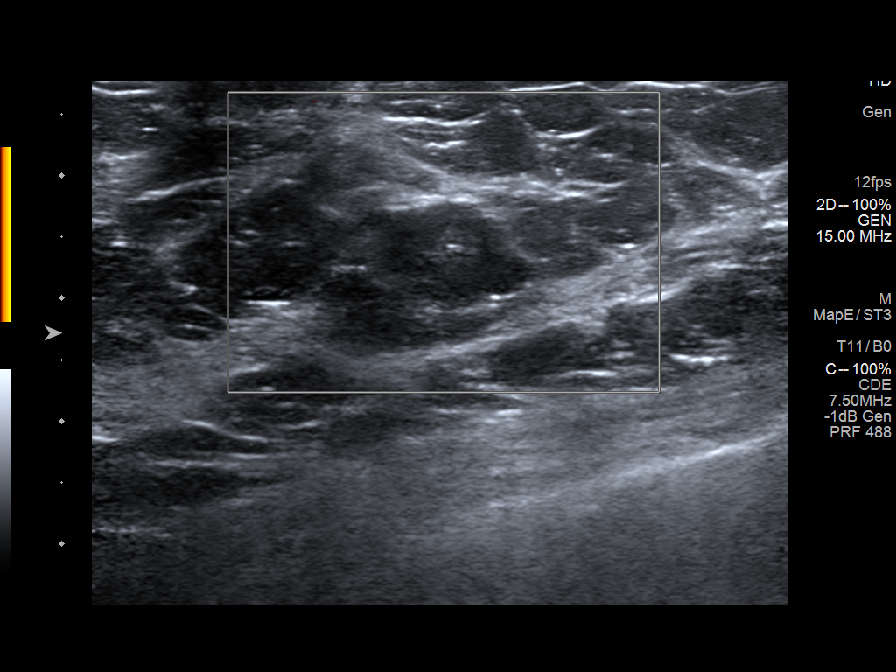
[im 7/8]
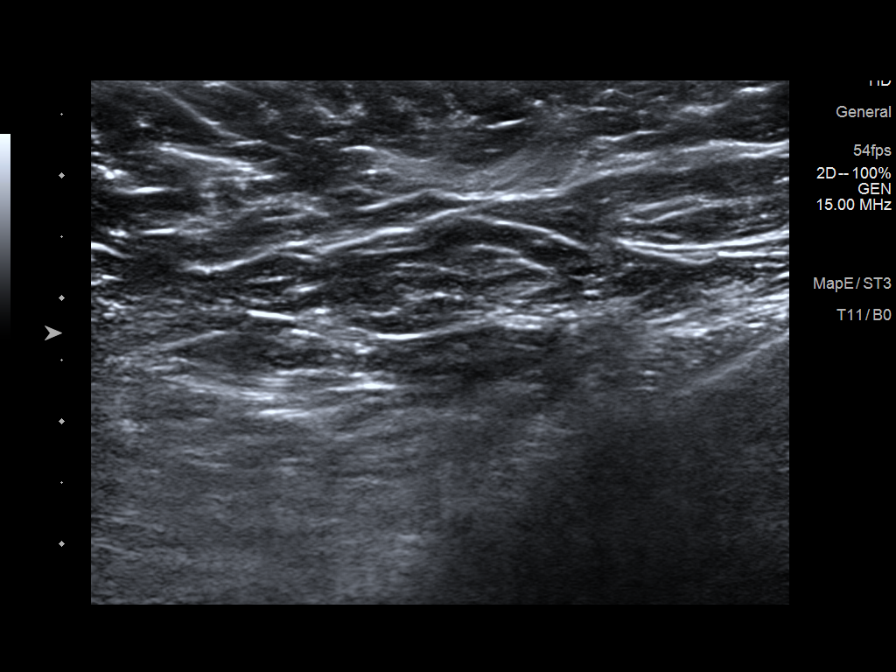
[im 8/8]
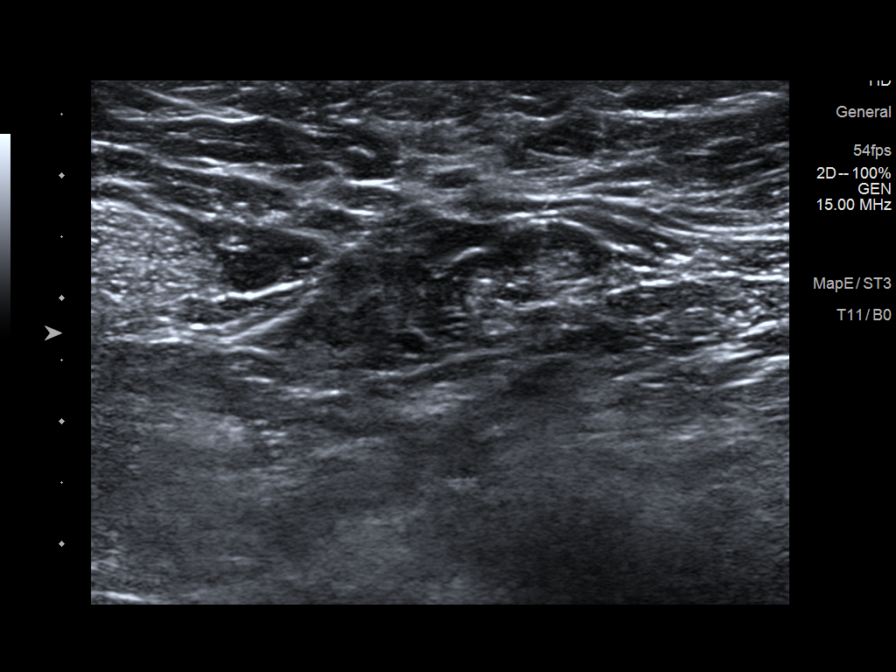

[8 of 8 positions shown; findings below may reference images not displayed]

ACR Breast Density Category b: There are scattered areas of
fibroglandular density.
FINDINGS: Additional mammographic views of the left breast demonstrate
interval significant increase in size of the moderately dense
macrolobulated mass in the left breast slightly lower inner
quadrant, middle depth. Post biopsy marker is located within this
mass.

Mammographic images were processed with CAD.

On physical exam, no suspicious masses are palpated.

Targeted ultrasound is performed, showing left breast 8 o'clock 1 cm
from the nipple hypoechoic lobulated mass which contains a post
biopsy tissue marker. It measures 1.3 x 0.9 x 1.4 cm. The prior
sonographic measurements of this mass were 0.9 x 0.6 x 0.8 cm.
IMPRESSION: Interval increase in size of the left breast 8 o'clock mass.

RECOMMENDATION:
Surgical consultation to consider excisional biopsy.

I have discussed the findings and recommendations with the patient.
Results were also provided in writing at the conclusion of the
visit. If applicable, a reminder letter will be sent to the patient
regarding the next appointment.

BI-RADS CATEGORY  4: Suspicious.

## 2020-02-22 ENCOUNTER — Other Ambulatory Visit: Payer: Self-pay | Admitting: Obstetrics and Gynecology

## 2020-02-22 DIAGNOSIS — Z1231 Encounter for screening mammogram for malignant neoplasm of breast: Secondary | ICD-10-CM

## 2020-03-28 ENCOUNTER — Ambulatory Visit
Admission: RE | Admit: 2020-03-28 | Discharge: 2020-03-28 | Disposition: A | Discharge status: Home / Self Care | Payer: 59 | Source: Ambulatory Visit | Attending: Obstetrics and Gynecology | Admitting: Obstetrics and Gynecology

## 2020-03-28 ENCOUNTER — Other Ambulatory Visit: Payer: Self-pay

## 2020-03-28 DIAGNOSIS — Z1231 Encounter for screening mammogram for malignant neoplasm of breast: Secondary | ICD-10-CM

## 2020-03-29 ENCOUNTER — Other Ambulatory Visit: Payer: Self-pay | Admitting: Obstetrics and Gynecology

## 2020-03-29 DIAGNOSIS — R928 Other abnormal and inconclusive findings on diagnostic imaging of breast: Secondary | ICD-10-CM

## 2020-03-29 NOTE — Progress Notes (Signed)
Category 0 mammogram, will obtain further views and reassess.  Pt to be contacted by [facility].

## 2020-04-11 ENCOUNTER — Ambulatory Visit
Admission: RE | Admit: 2020-04-11 | Discharge: 2020-04-11 | Disposition: A | Discharge status: Home / Self Care | Payer: 59 | Source: Ambulatory Visit | Attending: Obstetrics and Gynecology | Admitting: Obstetrics and Gynecology

## 2020-04-11 ENCOUNTER — Other Ambulatory Visit: Payer: Self-pay

## 2020-04-11 DIAGNOSIS — R928 Other abnormal and inconclusive findings on diagnostic imaging of breast: Secondary | ICD-10-CM

## 2020-04-11 NOTE — Progress Notes (Signed)
Benign simple cyst in the right breast.  Repeat mammogram 1 year

## 2020-06-28 ENCOUNTER — Other Ambulatory Visit: Payer: 59

## 2020-12-07 ENCOUNTER — Ambulatory Visit: Payer: Self-pay

## 2021-02-12 ENCOUNTER — Other Ambulatory Visit: Payer: Self-pay | Admitting: Obstetrics & Gynecology

## 2021-02-12 DIAGNOSIS — Z1231 Encounter for screening mammogram for malignant neoplasm of breast: Secondary | ICD-10-CM

## 2021-04-03 ENCOUNTER — Other Ambulatory Visit: Payer: Self-pay

## 2021-04-03 ENCOUNTER — Ambulatory Visit
Admission: RE | Admit: 2021-04-03 | Discharge: 2021-04-03 | Disposition: A | Discharge status: Home / Self Care | Payer: 59 | Source: Ambulatory Visit | Attending: Obstetrics & Gynecology | Admitting: Obstetrics & Gynecology

## 2021-04-03 DIAGNOSIS — Z1231 Encounter for screening mammogram for malignant neoplasm of breast: Secondary | ICD-10-CM

## 2021-04-19 ENCOUNTER — Encounter: Payer: Self-pay | Admitting: Adult Health

## 2021-04-19 ENCOUNTER — Other Ambulatory Visit (HOSPITAL_COMMUNITY)
Admission: RE | Admit: 2021-04-19 | Discharge: 2021-04-19 | Disposition: A | Discharge status: Home / Self Care | Payer: 59 | Source: Ambulatory Visit | Attending: Adult Health | Admitting: Adult Health

## 2021-04-19 ENCOUNTER — Other Ambulatory Visit: Payer: Self-pay

## 2021-04-19 ENCOUNTER — Ambulatory Visit (INDEPENDENT_AMBULATORY_CARE_PROVIDER_SITE_OTHER): Payer: 59 | Admitting: Adult Health

## 2021-04-19 VITALS — BP 111/73 | HR 98 | Ht 70.0 in | Wt 181.8 lb

## 2021-04-19 DIAGNOSIS — Z01419 Encounter for gynecological examination (general) (routine) without abnormal findings: Secondary | ICD-10-CM | POA: Diagnosis present

## 2021-04-19 DIAGNOSIS — Z1211 Encounter for screening for malignant neoplasm of colon: Secondary | ICD-10-CM | POA: Insufficient documentation

## 2021-04-19 LAB — HEMOCCULT GUIAC POC 1CARD (OFFICE): Fecal Occult Blood, POC: NEGATIVE

## 2021-04-19 NOTE — Progress Notes (Signed)
Patient ID: [name], female   DOB: 12/30/1976, 45 y.o.   MRN: [id number] History of Present Illness: [name] is a 45 year old white female,married, G3P0012 in for a well woman gyn exam and pap. PCP is Dr Hilma Favors.   Current Medications, Allergies, Past Medical History, Past Surgical History, Family History and Social History were reviewed in [employer] record.     Review of Systems: Patient denies any headaches, hearing loss, fatigue, blurred vision, shortness of breath, chest pain, abdominal pain, problems with bowel movements, urination, or intercourse. No joint pain or mood swings.    Physical Exam:BP 111/73 (BP Location: Right Arm, Patient Position: Sitting, Cuff Size: Normal)   Pulse 98   Ht 5\' 10"  (1.778 m)   Wt 181 lb 12.8 oz (82.5 kg)   BMI 26.09 kg/m  General:  Well developed, well nourished, no acute distress Skin:  Warm and dry Neck:  Midline trachea, normal thyroid, good ROM, no lymphadenopathy Lungs; Clear to auscultation bilaterally Breast:  No dominant palpable mass, retraction, or nipple discharge Cardiovascular: Regular rate and rhythm Abdomen:  Soft, non tender, no hepatosplenomegaly Pelvic:  External genitalia is normal in appearance, no lesions.  The vagina is normal in appearance. Urethra has no lesions or masses. The cervix is bulbous,pap with HR HPV genotyping performed.  Uterus is felt to be normal size, shape, and contour.  No adnexal masses or tenderness noted.Bladder is non tender, no masses felt. Rectal: Good sphincter tone, no polyps, or hemorrhoids felt.  Hemoccult negative. Extremities/musculoskeletal:  No swelling or varicosities noted, no clubbing or cyanosis Psych:  No mood changes, alert and cooperative,seems happy AA is 3 Fall risk is low PHQ 9 score is 0 GAD 7 score is 0  [facility] - 04/19/21 0859      Pregnancy Intention Screening   Does the patient want to become pregnant in the next year? No    Does the  patient's partner want to become pregnant in the next year? No    Would the patient like to discuss contraceptive options today? No      Contraception Wrap Up   Current Method Female Sterilization    End Method Female Sterilization    Contraception Counseling Provided No         Examination chaperoned by [employer] RN  Impression and Plan: 1. Encounter for gynecological examination with Papanicolaou smear of cervix Pap sent Physical in 1 year Pap in 3 if normal - Cytology - PAP( [place]) - CBC - Comprehensive metabolic panel - TSH - Lipid panel Mammogram yearly Colonoscopy at 46   2. Encounter for screening fecal occult blood testing  - POCT occult blood stool

## 2021-04-20 LAB — COMPREHENSIVE METABOLIC PANEL
ALT: 11 IU/L (ref 0–32)
AST: 14 IU/L (ref 0–40)
Albumin/Globulin Ratio: 2 (ref 1.2–2.2)
Albumin: 4.8 g/dL (ref 3.8–4.8)
Alkaline Phosphatase: 60 IU/L (ref 44–121)
BUN/Creatinine Ratio: 20 (ref 9–23)
BUN: 15 mg/dL (ref 6–24)
Bilirubin Total: 0.3 mg/dL (ref 0.0–1.2)
CO2: 18 mmol/L — ABNORMAL LOW (ref 20–29)
Calcium: 10 mg/dL (ref 8.7–10.2)
Chloride: 101 mmol/L (ref 96–106)
Creatinine, Ser: 0.75 mg/dL (ref 0.57–1.00)
Globulin, Total: 2.4 g/dL (ref 1.5–4.5)
Glucose: 96 mg/dL (ref 65–99)
Potassium: 4.8 mmol/L (ref 3.5–5.2)
Sodium: 138 mmol/L (ref 134–144)
Total Protein: 7.2 g/dL (ref 6.0–8.5)
eGFR: 101 mL/min/{1.73_m2} (ref >59)

## 2021-04-20 LAB — CBC
Hematocrit: 39.7 % (ref 34.0–46.6)
Hemoglobin: 13.8 g/dL (ref 11.1–15.9)
MCH: 31.7 pg (ref 26.6–33.0)
MCHC: 34.8 g/dL (ref 31.5–35.7)
MCV: 91 fL (ref 79–97)
Platelets: 247 10*3/uL (ref 150–450)
RBC: 4.36 x10E6/uL (ref 3.77–5.28)
RDW: 11.9 % (ref 11.7–15.4)
WBC: 6.7 10*3/uL (ref 3.4–10.8)

## 2021-04-20 LAB — CYTOLOGY - PAP
Comment: NEGATIVE
Diagnosis: NEGATIVE
High risk HPV: NEGATIVE

## 2021-04-20 LAB — LIPID PANEL
Chol/HDL Ratio: 3.3 ratio (ref 0.0–4.4)
Cholesterol, Total: 212 mg/dL — ABNORMAL HIGH (ref 100–199)
HDL: 65 mg/dL (ref >39)
LDL Chol Calc (NIH): 137 mg/dL — ABNORMAL HIGH (ref 0–99)
Triglycerides: 55 mg/dL (ref 0–149)
VLDL Cholesterol Cal: 10 mg/dL (ref 5–40)

## 2021-04-20 LAB — TSH: TSH: 1.8 u[IU]/mL (ref 0.450–4.500)

## 2022-03-04 ENCOUNTER — Other Ambulatory Visit: Payer: Self-pay | Admitting: Obstetrics & Gynecology

## 2022-03-04 DIAGNOSIS — Z1231 Encounter for screening mammogram for malignant neoplasm of breast: Secondary | ICD-10-CM

## 2022-03-19 ENCOUNTER — Ambulatory Visit (INDEPENDENT_AMBULATORY_CARE_PROVIDER_SITE_OTHER): Payer: 59 | Admitting: Women's Health

## 2022-03-19 ENCOUNTER — Other Ambulatory Visit: Payer: Self-pay | Admitting: Obstetrics & Gynecology

## 2022-03-19 ENCOUNTER — Other Ambulatory Visit: Payer: Self-pay

## 2022-03-19 ENCOUNTER — Encounter: Payer: Self-pay | Admitting: Women's Health

## 2022-03-19 VITALS — BP 111/70 | HR 91 | Ht 69.0 in | Wt 189.0 lb

## 2022-03-19 DIAGNOSIS — N6310 Unspecified lump in the right breast, unspecified quadrant: Secondary | ICD-10-CM | POA: Diagnosis not present

## 2022-03-19 DIAGNOSIS — Z803 Family history of malignant neoplasm of breast: Secondary | ICD-10-CM | POA: Diagnosis not present

## 2022-03-19 DIAGNOSIS — N63 Unspecified lump in unspecified breast: Secondary | ICD-10-CM

## 2022-03-19 NOTE — Progress Notes (Signed)
? ?  GYN VISIT ?Patient name: [name] MRN [id number]  Date of birth: [birth year] ?Chief Complaint:   ?lump in right breast ? ?History of Present Illness:   ?Lashayla Armes Kenzleigh Sedam is a 46 y.o. [phone] Caucasian female being seen today for painful Rt breast mass, found yesterday.  Strong family h/o breast cancer, mom dx @ 69yo (no genetic testing), MGM (no testing), MA dx in 5s (neg testing). Pt states she was told she didn't need testing. Last mammogram 04/03/21, birads 1. Has screening mammo scheduled 04/08/22 @ [facility].  ?No LMP recorded. Patient has had an ablation. ?The current method of family planning is tubal ligation.  ?Last pap 04/19/21. Results were: NILM w/ HRHPV negative ? ?Depression screen [facility] 2/9 04/19/2021 12/03/2018 11/19/2017 10/07/2016  ?Decreased Interest 0 0 0 0  ?Down, Depressed, Hopeless 0 0 0 0  ?PHQ - 2 Score 0 0 0 0  ?Altered sleeping 0 - - -  ?Tired, decreased energy 0 - - -  ?Change in appetite 0 - - -  ?Feeling bad or failure about yourself  0 - - -  ?Trouble concentrating 0 - - -  ?Moving slowly or fidgety/restless 0 - - -  ?Suicidal thoughts 0 - - -  ?PHQ-9 Score 0 - - -  ? ?  ?GAD 7 : Generalized Anxiety Score 04/19/2021  ?Nervous, Anxious, on Edge 0  ?Control/stop worrying 0  ?Worry too much - different things 0  ?Trouble relaxing 0  ?Restless 0  ?Easily annoyed or irritable 0  ?Afraid - awful might happen 0  ?Total GAD 7 Score 0  ? ? ? ?Review of Systems:   ?Pertinent items are noted in HPI ?Denies fever/chills, dizziness, headaches, visual disturbances, fatigue, shortness of breath, chest pain, abdominal pain, vomiting, abnormal vaginal discharge/itching/odor/irritation, problems with periods, bowel movements, urination, or intercourse unless otherwise stated above.  ?Pertinent History Reviewed:  ?Reviewed past medical,surgical, social, obstetrical and family history.  ?Reviewed problem list, medications and allergies. ?Physical Assessment:  ? ?Vitals:  ? 03/19/22  1612  ?BP: 111/70  ?Pulse: 91  ?Weight: 189 lb (85.7 kg)  ?Height: '5\' 9"'$  (1.753 m)  ?Body mass index is 27.91 kg/m?. ? ?     Physical Examination:  ? General appearance: alert, well appearing, and in no distress ? Mental status: alert, oriented to person, place, and time ? Skin: warm & dry  ? Cardiovascular: normal heart rate noted ? Respiratory: normal respiratory effort, no distress ? Breast: Lt normal, Rt 5x4cm firm tender mass 10-3 o'clock, no axillary nodes felt ? Abdomen: soft, non-tender  ? Pelvic: examination not indicated ? Extremities: no edema  ? ?Chaperone: Levy Pupa   ? ?No results found for this or any previous visit (from the past 24 hour(s)).  ?Assessment & Plan:  ?1) Painful large Rt breast mass w/ strong family h/o breast cancer> found yesterday, 5x4cm. [place] to switch appt to diagnostic, scheduled for 4/11 @ 0800, be there at 0745. Tyrer Cusick today, 24yrrisk=4.90%, Lifetime risk=22.80%, so qualifies for yearly MRIs as well.  ? ? ?Meds: No orders of the defined types were placed in this encounter. ? ? ?No orders of the defined types were placed in this encounter. ? ? ?No follow-ups on file. ? ?KRoma SchanzCNM, WHNP-BC ?03/19/2022 ?5:04 PM  ?

## 2022-03-19 NOTE — Patient Instructions (Signed)
4/11 @ 8am, be there at 7:45am ?

## 2022-04-08 ENCOUNTER — Ambulatory Visit: Payer: 59

## 2022-04-09 ENCOUNTER — Ambulatory Visit
Admission: RE | Admit: 2022-04-09 | Discharge: 2022-04-09 | Disposition: A | Discharge status: Home / Self Care | Payer: 59 | Source: Ambulatory Visit | Attending: Obstetrics & Gynecology | Admitting: Obstetrics & Gynecology

## 2022-04-09 DIAGNOSIS — N63 Unspecified lump in unspecified breast: Secondary | ICD-10-CM

## 2022-04-24 ENCOUNTER — Encounter: Payer: Self-pay | Admitting: Adult Health

## 2022-04-24 ENCOUNTER — Ambulatory Visit (INDEPENDENT_AMBULATORY_CARE_PROVIDER_SITE_OTHER): Payer: 59 | Admitting: Adult Health

## 2022-04-24 ENCOUNTER — Other Ambulatory Visit (HOSPITAL_COMMUNITY)
Admission: RE | Admit: 2022-04-24 | Discharge: 2022-04-24 | Disposition: A | Discharge status: Home / Self Care | Payer: 59 | Source: Ambulatory Visit | Attending: Adult Health | Admitting: Adult Health

## 2022-04-24 VITALS — BP 106/65 | HR 70 | Ht 68.0 in | Wt 193.0 lb

## 2022-04-24 DIAGNOSIS — Z1211 Encounter for screening for malignant neoplasm of colon: Secondary | ICD-10-CM

## 2022-04-24 DIAGNOSIS — N841 Polyp of cervix uteri: Secondary | ICD-10-CM | POA: Insufficient documentation

## 2022-04-24 DIAGNOSIS — Z720 Tobacco use: Secondary | ICD-10-CM | POA: Diagnosis not present

## 2022-04-24 DIAGNOSIS — Z01419 Encounter for gynecological examination (general) (routine) without abnormal findings: Secondary | ICD-10-CM

## 2022-04-24 LAB — HEMOCCULT GUIAC POC 1CARD (OFFICE): Fecal Occult Blood, POC: NEGATIVE

## 2022-04-24 NOTE — Addendum Note (Signed)
Addended by: Linton Rump on: 04/24/2022 09:30 AM ? ? Modules accepted: Orders ? ?

## 2022-04-24 NOTE — Progress Notes (Signed)
Patient ID: [name], female   DOB: 11-16-1976, 46 y.o.   MRN: [id number] ?History of Present Illness: ?[name] is a 46 year old white female, married, G3P0012, in for a well woman gyn exam. ? ?Lab Results  ?Component Value Date  ? DIAGPAP  04/19/2021  ?  - Negative for intraepithelial lesion or malignancy (NILM)  ? HPV NOT DETECTED 12/03/2018  ? [place] Negative 04/19/2021  ? PCP is Dr Hilma Favors. ? ?Current Medications, Allergies, Past Medical History, Past Surgical History, Family History and Social History were reviewed in [employer] record.   ? ? ?Review of Systems: ?Patient denies any headaches, hearing loss, fatigue, blurred vision, shortness of breath, chest pain, abdominal pain, problems with bowel movements, urination, or intercourse. No joint pain or mood swings.  ?Had recent mammogram with bilateral breast cysts  ?Had some spotting and wiped off flat tissue ? ? ?Physical Exam:BP 106/65 (BP Location: Left Arm, Patient Position: Sitting, Cuff Size: Normal)   Pulse 70   Ht '5\' 8"'$  (1.727 m)   Wt 193 lb (87.5 kg)   BMI 29.35 kg/m?   ?General:  Well developed, well nourished, no acute distress ?Skin:  Warm and dry ?Neck:  Midline trachea, normal thyroid, good ROM, no lymphadenopathy ?Lungs; Clear to auscultation bilaterally ?Breast:  No dominant palpable mass, retraction, or nipple discharge ?Cardiovascular: Regular rate and rhythm ?Abdomen:  Soft, non tender, no hepatosplenomegaly ?Pelvic:  External genitalia is normal in appearance, no lesions.  The vagina is normal in appearance. Urethra has no lesions or masses. The cervix is bulbous.Has 1 cm red cervical polyp at os, she gave verbal permission and polyp was removed by twisting with forceps, will send to pathology.  Uterus is felt to be normal size, shape, and contour.  No adnexal masses or tenderness noted.Bladder is non tender, no masses felt. ?Rectal: Good sphincter tone, no polyps, or hemorrhoids felt.  Hemoccult  negative. ?Extremities/musculoskeletal:  No swelling or varicosities noted, no clubbing or cyanosis ?Psych:  No mood changes, alert and cooperative,seems happy ?AA score is 2 ?Fall risk is low ? ?  04/24/2022  ?  8:37 AM 04/19/2021  ?  9:00 AM 12/03/2018  ?  3:55 PM  ?Depression screen PHQ 2/9  ?Decreased Interest 0 0 0  ?Down, Depressed, Hopeless 0 0 0  ?PHQ - 2 Score 0 0 0  ?Altered sleeping 1 0   ?Tired, decreased energy 1 0   ?Change in appetite 0 0   ?Feeling bad or failure about yourself  0 0   ?Trouble concentrating 0 0   ?Moving slowly or fidgety/restless 0 0   ?Suicidal thoughts 0 0   ?PHQ-9 Score 2 0   ?  ? ?  04/24/2022  ?  8:37 AM 04/19/2021  ?  9:00 AM  ?GAD 7 : Generalized Anxiety Score  ?Nervous, Anxious, on Edge 0 0  ?Control/stop worrying 0 0  ?Worry too much - different things 0 0  ?Trouble relaxing 1 0  ?Restless 0 0  ?Easily annoyed or irritable 1 0  ?Afraid - awful might happen 0 0  ?Total GAD 7 Score 2 0  ? ? [facility] - 04/24/22 0840   ? ?  ? Pregnancy Intention Screening  ? Does the patient want to become pregnant in the next year? No   ? Does the patient's partner want to become pregnant in the next year? No   ? Would the patient like to discuss contraceptive options today? No   ?  ?  Contraception Wrap Up  ? Current Method Female Sterilization   ? End Method Female Sterilization   ? Contraception Counseling Provided No   ? ?  ?  ? ?  ? Co exam with Balinda Quails NP student  ?  ?Impression and Plan: ? ?1. Encounter for well woman exam with routine gynecological exam ?Physical in 1 year ?Pap 2025 ?Mammogram yearly ? ?2. Encounter for screening fecal occult blood testing ?Hemoccult was negative  ?[place] at 76  ? ?3. Cervical polyp ?Cervical polyp sent to pathology ? ?4. Tobacco use ?She is cutting down  ? ? ?  ?  ?

## 2022-04-25 LAB — SURGICAL PATHOLOGY

## 2023-08-20 ENCOUNTER — Encounter: Payer: Self-pay | Admitting: Adult Health

## 2023-08-20 ENCOUNTER — Ambulatory Visit (INDEPENDENT_AMBULATORY_CARE_PROVIDER_SITE_OTHER): Payer: 59 | Admitting: Adult Health

## 2023-08-20 VITALS — BP 104/54 | HR 68 | Ht 69.0 in | Wt 176.5 lb

## 2023-08-20 DIAGNOSIS — Z1211 Encounter for screening for malignant neoplasm of colon: Secondary | ICD-10-CM | POA: Diagnosis not present

## 2023-08-20 DIAGNOSIS — Z01419 Encounter for gynecological examination (general) (routine) without abnormal findings: Secondary | ICD-10-CM | POA: Diagnosis not present

## 2023-08-20 DIAGNOSIS — N6311 Unspecified lump in the right breast, upper outer quadrant: Secondary | ICD-10-CM

## 2023-08-20 LAB — HEMOCCULT GUIAC POC 1CARD (OFFICE): Fecal Occult Blood, POC: NEGATIVE

## 2023-08-20 NOTE — Progress Notes (Signed)
Patient ID: [name], female   DOB: 18-Jul-1976, 47 y.o.   MRN: [id number] History of Present Illness: [name] is a 47 year old white female, separated, [id number] in for a well woman gyn exam and has right breast mass for about 2 weeks and is tender.     Component Value Date/Time   DIAGPAP  04/19/2021 0856    - Negative for intraepithelial lesion or malignancy (NILM)   DIAGPAP  12/03/2018 0000    NEGATIVE FOR INTRAEPITHELIAL LESIONS OR MALIGNANCY.   HPVHIGH Negative 04/19/2021 0856   ADEQPAP  04/19/2021 0856    Satisfactory for evaluation; transformation zone component PRESENT.   ADEQPAP  12/03/2018 0000    Satisfactory for evaluation  endocervical/transformation zone component PRESENT.    PCP is Dr Phillips Odor    Current Medications, Allergies, Past Medical History, Past Surgical History, Family History and Social History were reviewed in [employer] electronic medical record.     Review of Systems: Patient denies any headaches, hearing loss, fatigue, blurred vision, shortness of breath, chest pain, abdominal pain, problems with bowel movements, urination, or intercourse(not active). No joint pain or mood swings.  +right breast mass    Physical Exam:BP (!) 104/54 (BP Location: Left Arm, Patient Position: Sitting, Cuff Size: Normal)   Pulse 68   Ht 5\' 9"  (1.753 m)   Wt 176 lb 8 oz (80.1 kg)   BMI 26.06 kg/m   General:  Well developed, well nourished, no acute distress Skin:  Warm and dry Neck:  Midline trachea, normal thyroid, good ROM, no lymphadenopathy Lungs; Clear to auscultation bilaterally Breast:  No dominant palpable mass, retraction, or nipple discharge in left breast, on the right, no retraction or nipple discharge, has irregular shaped mass that is tender 9-12 0' clock, and pea sized mass a 3 o'clock  Cardiovascular: Regular rate and rhythm Abdomen:  Soft, non tender, no hepatosplenomegaly Pelvic:  External genitalia is normal in appearance, no lesions.   The vagina is normal in appearance. Urethra has no lesions or masses. The cervix is smooth.  Uterus is felt to be normal size, shape, and contour.  No adnexal masses or tenderness noted.Bladder is non tender, no masses felt. Rectal: Good sphincter tone, no polyps, +hemorrhoids felt.  Hemoccult negative. Extremities/musculoskeletal:  No swelling or varicosities noted, no clubbing or cyanosis Psych:  No mood changes, alert and cooperative,seems happy AA is 2 Fall risk is low    08/20/2023    1:40 PM 04/24/2022    8:37 AM 04/19/2021    9:00 AM  Depression screen PHQ 2/9  Decreased Interest 0 0 0  Down, Depressed, Hopeless 0 0 0  PHQ - 2 Score 0 0 0  Altered sleeping 0 1 0  Tired, decreased energy 0 1 0  Change in appetite 0 0 0  Feeling bad or failure about yourself  0 0 0  Trouble concentrating 0 0 0  Moving slowly or fidgety/restless 0 0 0  Suicidal thoughts 0 0 0  PHQ-9 Score 0 2 0       08/20/2023    1:40 PM 04/24/2022    8:37 AM 04/19/2021    9:00 AM  GAD 7 : Generalized Anxiety Score  Nervous, Anxious, on Edge 0 0 0  Control/stop worrying 1 0 0  Worry too much - different things 1 0 0  Trouble relaxing 1 1 0  Restless 0 0 0  Easily annoyed or irritable 0 1 0  Afraid - awful might happen 0  0 0  Total GAD 7 Score 3 2 0      Upstream - 08/20/23 1339       Pregnancy Intention Screening   Does the patient want to become pregnant in the next year? N/A    Does the patient's partner want to become pregnant in the next year? N/A    Would the patient like to discuss contraceptive options today? N/A      Contraception Wrap Up   Current Method Female Sterilization    End Method Female Sterilization    Contraception Counseling Provided No             Examination chaperoned by Malachy Mood LPN  Impression and Plan: 1. Encounter for well woman exam with routine gynecological exam Pap sent physical in 1 year Labs next year  Colonoscopy per GI   2. Encounter for screening  fecal occult blood testing Hemoccult was negative  - POCT occult blood stool  3. Mass of upper outer quadrant of right breast -irregular shaped mass that is tender 9-12 0' clock, and pea sized mass a 3 o'clock  Diagnostic mammogram and [place] scheduled for 09/16/23 at 2:20 pm at [facility] to assess  - [place] LIMITED ULTRASOUND INCLUDING AXILLA RIGHT BREAST; Future - MM 3D DIAGNOSTIC MAMMOGRAM BILATERAL BREAST; Future - [place] LIMITED ULTRASOUND INCLUDING AXILLA LEFT BREAST ; Future

## 2023-09-16 ENCOUNTER — Ambulatory Visit (HOSPITAL_COMMUNITY)
Admission: RE | Admit: 2023-09-16 | Discharge: 2023-09-16 | Disposition: A | Discharge status: Home / Self Care | Payer: 59 | Source: Ambulatory Visit | Attending: Adult Health | Admitting: Adult Health

## 2023-09-16 ENCOUNTER — Encounter (HOSPITAL_COMMUNITY): Payer: Self-pay

## 2023-09-16 DIAGNOSIS — N6311 Unspecified lump in the right breast, upper outer quadrant: Secondary | ICD-10-CM | POA: Diagnosis present

## 2023-09-24 ENCOUNTER — Ambulatory Visit: Payer: 59 | Admitting: Adult Health
